# Patient Record
Sex: Female | Born: 1950 | Race: White | Hispanic: No | Marital: Married | Smoking: Never smoker
Health system: Southern US, Community
[De-identification: ages and names within clinical notes are randomized; demographics above are authoritative.]

## PROBLEM LIST (undated history)

## (undated) DIAGNOSIS — C801 Malignant (primary) neoplasm, unspecified: Secondary | ICD-10-CM

## (undated) DIAGNOSIS — G2581 Restless legs syndrome: Secondary | ICD-10-CM

## (undated) DIAGNOSIS — F419 Anxiety disorder, unspecified: Secondary | ICD-10-CM

## (undated) DIAGNOSIS — D259 Leiomyoma of uterus, unspecified: Secondary | ICD-10-CM

## (undated) DIAGNOSIS — E785 Hyperlipidemia, unspecified: Secondary | ICD-10-CM

## (undated) DIAGNOSIS — K589 Irritable bowel syndrome without diarrhea: Secondary | ICD-10-CM

## (undated) DIAGNOSIS — B029 Zoster without complications: Secondary | ICD-10-CM

## (undated) DIAGNOSIS — O3410 Maternal care for benign tumor of corpus uteri, unspecified trimester: Secondary | ICD-10-CM

## (undated) DIAGNOSIS — K5792 Diverticulitis of intestine, part unspecified, without perforation or abscess without bleeding: Secondary | ICD-10-CM

## (undated) DIAGNOSIS — M797 Fibromyalgia: Secondary | ICD-10-CM

## (undated) DIAGNOSIS — I82409 Acute embolism and thrombosis of unspecified deep veins of unspecified lower extremity: Secondary | ICD-10-CM

## (undated) DIAGNOSIS — N3010 Interstitial cystitis (chronic) without hematuria: Secondary | ICD-10-CM

## (undated) DIAGNOSIS — K529 Noninfective gastroenteritis and colitis, unspecified: Secondary | ICD-10-CM

## (undated) DIAGNOSIS — Z01419 Encounter for gynecological examination (general) (routine) without abnormal findings: Secondary | ICD-10-CM

## (undated) DIAGNOSIS — K6289 Other specified diseases of anus and rectum: Secondary | ICD-10-CM

## (undated) HISTORY — PX: TONSILLECTOMY: SUR1361

## (undated) HISTORY — DX: Anxiety disorder, unspecified: F41.9

## (undated) HISTORY — DX: Acute embolism and thrombosis of unspecified deep veins of unspecified lower extremity: I82.409

## (undated) HISTORY — DX: Interstitial cystitis (chronic) without hematuria: N30.10

## (undated) HISTORY — DX: Irritable bowel syndrome, unspecified: K58.9

## (undated) HISTORY — DX: Malignant (primary) neoplasm, unspecified: C80.1

## (undated) HISTORY — DX: Hyperlipidemia, unspecified: E78.5

## (undated) HISTORY — DX: Fibromyalgia: M79.7

## (undated) HISTORY — DX: Leiomyoma of uterus, unspecified: O34.10

## (undated) HISTORY — DX: Maternal care for benign tumor of corpus uteri, unspecified trimester: D25.9

## (undated) HISTORY — PX: COLONOSCOPY: SHX174

## (undated) HISTORY — DX: Encounter for gynecological examination (general) (routine) without abnormal findings: Z01.419

## (undated) HISTORY — PX: CHOLECYSTECTOMY: SHX55

## (undated) HISTORY — DX: Restless legs syndrome: G25.81

## (undated) HISTORY — DX: Other specified diseases of anus and rectum: K62.89

## (undated) HISTORY — DX: Diverticulitis of intestine, part unspecified, without perforation or abscess without bleeding: K57.92

## (undated) HISTORY — DX: Noninfective gastroenteritis and colitis, unspecified: K52.9

## (undated) HISTORY — DX: Zoster without complications: B02.9

---

## 1998-03-29 ENCOUNTER — Ambulatory Visit (HOSPITAL_COMMUNITY): Admission: RE | Admit: 1998-03-29 | Discharge: 1998-03-29 | Payer: Self-pay | Admitting: *Deleted

## 1998-08-07 ENCOUNTER — Other Ambulatory Visit: Admission: RE | Admit: 1998-08-07 | Discharge: 1998-08-07 | Payer: Self-pay | Admitting: Obstetrics and Gynecology

## 1999-08-13 ENCOUNTER — Other Ambulatory Visit: Admission: RE | Admit: 1999-08-13 | Discharge: 1999-08-13 | Payer: Self-pay | Admitting: Obstetrics and Gynecology

## 2000-04-14 ENCOUNTER — Encounter: Payer: Self-pay | Admitting: Otolaryngology

## 2000-04-14 ENCOUNTER — Ambulatory Visit (HOSPITAL_COMMUNITY): Admission: RE | Admit: 2000-04-14 | Discharge: 2000-04-14 | Payer: Self-pay | Admitting: Otolaryngology

## 2000-08-05 ENCOUNTER — Other Ambulatory Visit: Admission: RE | Admit: 2000-08-05 | Discharge: 2000-08-05 | Payer: Self-pay | Admitting: Obstetrics and Gynecology

## 2001-09-06 ENCOUNTER — Other Ambulatory Visit: Admission: RE | Admit: 2001-09-06 | Discharge: 2001-09-06 | Payer: Self-pay | Admitting: Obstetrics and Gynecology

## 2001-09-17 ENCOUNTER — Encounter: Payer: Self-pay | Admitting: Family Medicine

## 2001-09-17 ENCOUNTER — Encounter: Admission: RE | Admit: 2001-09-17 | Discharge: 2001-09-17 | Payer: Self-pay | Admitting: Family Medicine

## 2001-10-25 ENCOUNTER — Ambulatory Visit (HOSPITAL_BASED_OUTPATIENT_CLINIC_OR_DEPARTMENT_OTHER): Admission: RE | Admit: 2001-10-25 | Discharge: 2001-10-25 | Payer: Self-pay | Admitting: Pulmonary Disease

## 2001-11-15 ENCOUNTER — Encounter: Admission: RE | Admit: 2001-11-15 | Discharge: 2001-11-15 | Payer: Self-pay | Admitting: Internal Medicine

## 2001-11-15 ENCOUNTER — Encounter: Payer: Self-pay | Admitting: Internal Medicine

## 2002-11-25 ENCOUNTER — Other Ambulatory Visit: Admission: RE | Admit: 2002-11-25 | Discharge: 2002-11-25 | Payer: Self-pay | Admitting: Obstetrics and Gynecology

## 2004-04-01 ENCOUNTER — Other Ambulatory Visit: Admission: RE | Admit: 2004-04-01 | Discharge: 2004-04-01 | Payer: Self-pay | Admitting: Obstetrics and Gynecology

## 2004-05-07 ENCOUNTER — Ambulatory Visit (HOSPITAL_COMMUNITY): Admission: RE | Admit: 2004-05-07 | Discharge: 2004-05-07 | Payer: Self-pay | Admitting: Internal Medicine

## 2004-08-23 ENCOUNTER — Ambulatory Visit: Payer: Self-pay | Admitting: Family Medicine

## 2005-02-03 ENCOUNTER — Ambulatory Visit: Payer: Self-pay | Admitting: Family Medicine

## 2005-05-08 ENCOUNTER — Encounter: Admission: RE | Admit: 2005-05-08 | Discharge: 2005-05-08 | Payer: Self-pay | Admitting: Internal Medicine

## 2005-05-08 ENCOUNTER — Ambulatory Visit: Payer: Self-pay | Admitting: Family Medicine

## 2005-05-08 ENCOUNTER — Inpatient Hospital Stay (HOSPITAL_COMMUNITY): Admission: EM | Admit: 2005-05-08 | Discharge: 2005-05-10 | Payer: Self-pay | Admitting: Emergency Medicine

## 2005-05-08 ENCOUNTER — Ambulatory Visit: Payer: Self-pay | Admitting: Internal Medicine

## 2005-05-13 ENCOUNTER — Ambulatory Visit: Payer: Self-pay | Admitting: Family Medicine

## 2005-07-21 ENCOUNTER — Other Ambulatory Visit: Admission: RE | Admit: 2005-07-21 | Discharge: 2005-07-21 | Payer: Self-pay | Admitting: Obstetrics and Gynecology

## 2005-09-17 ENCOUNTER — Ambulatory Visit: Payer: Self-pay | Admitting: Family Medicine

## 2005-12-08 ENCOUNTER — Ambulatory Visit: Payer: Self-pay | Admitting: Family Medicine

## 2006-02-18 ENCOUNTER — Ambulatory Visit: Payer: Self-pay | Admitting: Family Medicine

## 2006-03-02 ENCOUNTER — Ambulatory Visit: Payer: Self-pay | Admitting: Family Medicine

## 2006-05-08 ENCOUNTER — Ambulatory Visit: Payer: Self-pay | Admitting: Family Medicine

## 2007-04-20 ENCOUNTER — Ambulatory Visit: Payer: Self-pay | Admitting: Family Medicine

## 2007-04-20 LAB — CONVERTED CEMR LAB
ALT: 22 units/L (ref 0–35)
AST: 28 units/L (ref 0–37)
BUN: 8 mg/dL (ref 6–23)
Bilirubin, Direct: 0.1 mg/dL (ref 0.0–0.3)
CO2: 32 meq/L (ref 19–32)
Calcium: 9.7 mg/dL (ref 8.4–10.5)
Chloride: 116 meq/L — ABNORMAL HIGH (ref 96–112)
Creatinine, Ser: 0.9 mg/dL (ref 0.4–1.2)
Eosinophils Absolute: 0.3 10*3/uL (ref 0.0–0.6)
Eosinophils Relative: 3.6 % (ref 0.0–5.0)
GFR calc Af Amer: 84 mL/min
Glucose, Bld: 92 mg/dL (ref 70–99)
MCV: 91.3 fL (ref 78.0–100.0)
Monocytes Relative: 6.8 % (ref 3.0–11.0)
Neutro Abs: 4.4 10*3/uL (ref 1.4–7.7)
Neutrophils Relative %: 57 % (ref 43.0–77.0)
Potassium: 4.6 meq/L (ref 3.5–5.1)
RBC: 4.56 M/uL (ref 3.87–5.11)
RDW: 12.2 % (ref 11.5–14.6)
Total Bilirubin: 0.8 mg/dL (ref 0.3–1.2)
Total CHOL/HDL Ratio: 3.7
Triglycerides: 90 mg/dL (ref 0–149)

## 2007-04-27 ENCOUNTER — Ambulatory Visit: Payer: Self-pay | Admitting: Family Medicine

## 2007-12-16 ENCOUNTER — Encounter: Payer: Self-pay | Admitting: Family Medicine

## 2008-02-15 ENCOUNTER — Emergency Department: Payer: Self-pay | Admitting: Internal Medicine

## 2008-02-21 ENCOUNTER — Ambulatory Visit: Payer: Self-pay | Admitting: Family Medicine

## 2008-02-21 DIAGNOSIS — K589 Irritable bowel syndrome without diarrhea: Secondary | ICD-10-CM | POA: Insufficient documentation

## 2008-02-21 DIAGNOSIS — E785 Hyperlipidemia, unspecified: Secondary | ICD-10-CM

## 2008-02-21 DIAGNOSIS — F411 Generalized anxiety disorder: Secondary | ICD-10-CM

## 2008-02-21 DIAGNOSIS — R040 Epistaxis: Secondary | ICD-10-CM

## 2008-02-21 DIAGNOSIS — Z86718 Personal history of other venous thrombosis and embolism: Secondary | ICD-10-CM | POA: Insufficient documentation

## 2008-02-21 DIAGNOSIS — J45909 Unspecified asthma, uncomplicated: Secondary | ICD-10-CM | POA: Insufficient documentation

## 2008-02-21 DIAGNOSIS — D259 Leiomyoma of uterus, unspecified: Secondary | ICD-10-CM | POA: Insufficient documentation

## 2008-02-21 DIAGNOSIS — J309 Allergic rhinitis, unspecified: Secondary | ICD-10-CM | POA: Insufficient documentation

## 2008-05-01 ENCOUNTER — Ambulatory Visit: Payer: Self-pay | Admitting: Family Medicine

## 2008-05-01 LAB — CONVERTED CEMR LAB
Bilirubin Urine: NEGATIVE
Ketones, urine, test strip: NEGATIVE
pH: 5

## 2008-05-02 LAB — CONVERTED CEMR LAB
AST: 23 units/L (ref 0–37)
Albumin: 3.6 g/dL (ref 3.5–5.2)
Alkaline Phosphatase: 83 units/L (ref 39–117)
Basophils Absolute: 0.1 10*3/uL (ref 0.0–0.1)
Basophils Relative: 1.2 % — ABNORMAL HIGH (ref 0.0–1.0)
Calcium: 9.5 mg/dL (ref 8.4–10.5)
Creatinine, Ser: 0.8 mg/dL (ref 0.4–1.2)
Glucose, Bld: 94 mg/dL (ref 70–99)
HCT: 40.1 % (ref 36.0–46.0)
Hemoglobin: 13.6 g/dL (ref 12.0–15.0)
LDL Cholesterol: 126 mg/dL — ABNORMAL HIGH (ref 0–99)
MCHC: 34 g/dL (ref 30.0–36.0)
MCV: 92.7 fL (ref 78.0–100.0)
Neutro Abs: 4.4 10*3/uL (ref 1.4–7.7)
Neutrophils Relative %: 53.2 % (ref 43.0–77.0)
RDW: 12.3 % (ref 11.5–14.6)
Sodium: 144 meq/L (ref 135–145)
Total Protein: 6.7 g/dL (ref 6.0–8.3)
WBC: 8.3 10*3/uL (ref 4.5–10.5)

## 2008-05-05 ENCOUNTER — Ambulatory Visit: Payer: Self-pay | Admitting: Family Medicine

## 2008-05-05 DIAGNOSIS — F411 Generalized anxiety disorder: Secondary | ICD-10-CM | POA: Insufficient documentation

## 2008-05-05 DIAGNOSIS — Z8719 Personal history of other diseases of the digestive system: Secondary | ICD-10-CM

## 2008-05-05 DIAGNOSIS — G2581 Restless legs syndrome: Secondary | ICD-10-CM | POA: Insufficient documentation

## 2008-06-05 ENCOUNTER — Emergency Department: Payer: Self-pay | Admitting: Emergency Medicine

## 2009-05-16 ENCOUNTER — Ambulatory Visit: Payer: Self-pay | Admitting: Family Medicine

## 2009-05-16 LAB — CONVERTED CEMR LAB
Bilirubin Urine: NEGATIVE
Glucose, Urine, Semiquant: NEGATIVE
Nitrite: NEGATIVE
Urobilinogen, UA: 0.2
WBC Urine, dipstick: NEGATIVE

## 2009-05-17 LAB — CONVERTED CEMR LAB
ALT: 26 units/L (ref 0–35)
AST: 24 units/L (ref 0–37)
Albumin: 4.3 g/dL (ref 3.5–5.2)
Alkaline Phosphatase: 97 units/L (ref 39–117)
BUN: 13 mg/dL (ref 6–23)
Basophils Relative: 0.5 % (ref 0.0–3.0)
CO2: 28 meq/L (ref 19–32)
Calcium: 9.7 mg/dL (ref 8.4–10.5)
Cholesterol: 214 mg/dL — ABNORMAL HIGH (ref 0–200)
Direct LDL: 147.7 mg/dL
Eosinophils Absolute: 0.4 10*3/uL (ref 0.0–0.7)
Glucose, Bld: 81 mg/dL (ref 70–99)
Lymphocytes Relative: 31.6 % (ref 12.0–46.0)
Monocytes Absolute: 0.6 10*3/uL (ref 0.1–1.0)
Neutro Abs: 4.5 10*3/uL (ref 1.4–7.7)
Potassium: 3.8 meq/L (ref 3.5–5.1)
RBC: 4.56 M/uL (ref 3.87–5.11)
RDW: 12.3 % (ref 11.5–14.6)
Sodium: 143 meq/L (ref 135–145)
TSH: 2.12 microintl units/mL (ref 0.35–5.50)
Total CHOL/HDL Ratio: 4
Triglycerides: 108 mg/dL (ref 0.0–149.0)
VLDL: 21.6 mg/dL (ref 0.0–40.0)

## 2009-06-01 ENCOUNTER — Ambulatory Visit: Payer: Self-pay | Admitting: Family Medicine

## 2009-11-21 ENCOUNTER — Ambulatory Visit: Payer: Self-pay | Admitting: Family Medicine

## 2009-11-21 DIAGNOSIS — J1189 Influenza due to unidentified influenza virus with other manifestations: Secondary | ICD-10-CM

## 2010-01-27 ENCOUNTER — Encounter: Payer: Self-pay | Admitting: Family Medicine

## 2010-01-28 ENCOUNTER — Ambulatory Visit: Payer: Self-pay | Admitting: Family Medicine

## 2010-01-28 ENCOUNTER — Telehealth (INDEPENDENT_AMBULATORY_CARE_PROVIDER_SITE_OTHER): Payer: Self-pay | Admitting: *Deleted

## 2010-01-28 DIAGNOSIS — K5732 Diverticulitis of large intestine without perforation or abscess without bleeding: Secondary | ICD-10-CM

## 2010-02-01 ENCOUNTER — Telehealth: Payer: Self-pay | Admitting: Family Medicine

## 2010-02-05 ENCOUNTER — Ambulatory Visit: Payer: Self-pay | Admitting: Family Medicine

## 2010-02-11 ENCOUNTER — Telehealth: Payer: Self-pay | Admitting: Family Medicine

## 2010-02-24 ENCOUNTER — Encounter: Payer: Self-pay | Admitting: Family Medicine

## 2010-08-17 ENCOUNTER — Encounter: Payer: Self-pay | Admitting: Family Medicine

## 2010-08-19 ENCOUNTER — Encounter: Payer: Self-pay | Admitting: Family Medicine

## 2010-09-23 ENCOUNTER — Ambulatory Visit: Payer: Self-pay | Admitting: Otolaryngology

## 2010-11-10 ENCOUNTER — Encounter: Payer: Self-pay | Admitting: Family Medicine

## 2010-11-21 NOTE — Assessment & Plan Note (Signed)
Summary: fu on diverticulitis/pt no better/njr   Vital Signs:  Patient profile:   60 year old female Weight:      149 pounds BMI:     28.49 O2 Sat:      98 % on Room air Temp:     98.2 degrees F oral Pulse rate:   64 / minute Pulse rhythm:   regular BP sitting:   106 / 74  (left arm) Cuff size:   regular  Vitals Entered By: Raechel Ache, RN (February 05, 2010 11:32 AM)  O2 Flow:  Room air CC: F/u diverticulitis- still has tenderness LLQ. Now has SOB and productive cough with brown mucous and had fever 100.7 yesterday. Went to Urgent Care- on Avelox and Flovent. Finished Cipro- has 2 Flagyl left.   History of Present Illness: Here for follow up. She finished a course of Cipro and Flagyl for her diverticulosis this past weekend. She feels like she is slowly but steadily getting better. The pain in her abdomen is almost gone. her fever has decreased from 101 degrees to 99.4 this am. Also over the weekend she developed chest congestion and a deep cough producing green sputum. She saw Urgent Care on Sunday, and they put her on 7 days of Avelox. Now the cough is better. her appetite is improving. No NVD.   Allergies: 1)  ! Iodine 2)  ! Versed 3)  ! Biaxin 4)  ! * Diphtheria Vaccine 5)  ! Nubain 6)  ! Demerol 7)  ! Floxin  Past History:  Past Medical History: Reviewed history from 06/01/2009 and no changes required. IBS Allergic rhinitis Asthma DVT, hx of Hyperlipidemia Uterine fibroids interstitial cystitis, sees Dr. Logan Bores sees Dr. Rana Snare for gyn exams fibromyalgia Diverticulitis, hx of, sees Dr. Arlyce Dice Anxiety sees Dr. Elenore Rota at [facility]  for ENT care restless legs  Review of Systems  The patient denies anorexia, weight loss, weight gain, vision loss, decreased hearing, hoarseness, chest pain, syncope, dyspnea on exertion, peripheral edema, headaches, hemoptysis, abdominal pain, melena, hematochezia, severe indigestion/heartburn, hematuria, incontinence, genital  sores, muscle weakness, suspicious skin lesions, transient blindness, difficulty walking, depression, unusual weight change, abnormal bleeding, enlarged lymph nodes, angioedema, breast masses, and testicular masses.    Physical Exam  General:  Well-developed,well-nourished,in no acute distress; alert,appropriate and cooperative throughout examination Head:  Normocephalic and atraumatic without obvious abnormalities. No apparent alopecia or balding. Eyes:  No corneal or conjunctival inflammation noted. EOMI. Perrla. Funduscopic exam benign, without hemorrhages, exudates or papilledema. Vision grossly normal. Ears:  External ear exam shows no significant lesions or deformities.  Otoscopic examination reveals clear canals, tympanic membranes are intact bilaterally without bulging, retraction, inflammation or discharge. Hearing is grossly normal bilaterally. Nose:  External nasal examination shows no deformity or inflammation. Nasal mucosa are pink and moist without lesions or exudates. Mouth:  Oral mucosa and oropharynx without lesions or exudates.  Teeth in good repair. Neck:  No deformities, masses, or tenderness noted. Lungs:  Normal respiratory effort, chest expands symmetrically. Lungs are clear to auscultation, no crackles or wheezes. Abdomen:  Bowel sounds positive,abdomen soft and non-tender without masses, organomegaly or hernias noted.   Impression & Recommendations:  Problem # 1:  DIVERTICULITIS OF COLON (ICD-562.11)  Problem # 2:  ACUTE BRONCHITIS (ICD-466.0)  Her updated medication list for this problem includes:    Proair Hfa 108 (90 Base) Mcg/act Aers (Albuterol sulfate) .Marland Kitchen... 2 inh q4h as needed shortness of breath  Complete Medication List: 1)  Lipitor 10 Mg  Tabs (Atorvastatin calcium) .Marland Kitchen.. 1 by mouth daily 2)  Proair Hfa 108 (90 Base) Mcg/act Aers (Albuterol sulfate) .... 2 inh q4h as needed shortness of breath 3)  Caltrate 600+d 600-400 Mg-unit Tabs (Calcium  carbonate-vitamin d) .... Take 1 tablet by mouth two times a day 4)  Metamucil 30.9 % Powd (Psyllium) .... As needed/daily 5)  Estrace 0.1 Mg/gm Crea (Estradiol) .... Once daily as needed 6)  Alprazolam 0.25 Mg Tabs (Alprazolam) .... Two times a day as needed anxiety 7)  Macrodantin (pt Unsure of Dosage)  8)  Estradiol  9)  Sonata 5 Mg Caps (Zaleplon) .... At bedtime  Patient Instructions: 1)  The diverticulitis has resolved, and now she has some bronchitis. Finish out the Avelox.

## 2010-11-21 NOTE — Progress Notes (Signed)
Summary: med making her sick  Phone Note Call from Patient Call back at Home Phone [phone]   Caller: Patient Call For: Nelwyn Salisbury MD Summary of Call: Flagyl is making her sick and is throwing up- worried she's not going to get better because not keeping med down. Is there something else besides Flagyl?  Lower dose?  [facility] [address] [place]. Initial call taken by: Raechel Ache, RN,  February 01, 2010 8:03 AM  Follow-up for Phone Call        stop Flagyl but continue on Cipro. Let us know how she is doing next week.  Follow-up by: Nelwyn Salisbury MD,  February 01, 2010 8:40 AM  Additional Follow-up for Phone Call Additional follow up Details #1::        LMTCB Additional Follow-up by: Raechel Ache, RN,  February 01, 2010 8:46 AM     Appended Document: med making her sick called.

## 2010-11-21 NOTE — Letter (Signed)
Summary: [facility]   Imported By: Maryln Gottron 03/04/2010 13:01:42  _____________________________________________________________________  External Attachment:    Type:   Image     Comment:   External Document

## 2010-11-21 NOTE — Assessment & Plan Note (Signed)
Summary: COUGH, ACHING, SOB // RS   Vital Signs:  Patient profile:   60 year old female Weight:      150 pounds Temp:     101.4 degrees F oral Pulse rate:   94 / minute BP sitting:   136 / 80  (left arm) Cuff size:   regular  Vitals Entered By: Alfred Levins, CMA (November 21, 2009 11:43 AM) CC: fever, chills, achy x1 day   History of Present Illness: Here for several different symptom complexes. First about 2 weeks ago she developed HAs, sinus pressure, ST, and PND. These symptoms continue to bother her. Then yesterday she had the sudden onset of fevers, body aches, and a dry cough. She is very fatigued. No NVD. On Ibuprofen and fluids.   Allergies: 1)  ! Iodine 2)  ! Versed 3)  ! Biaxin 4)  ! * Diphtheria Vaccine 5)  ! Nubain 6)  ! Demerol 7)  ! Floxin  Past History:  Past Medical History: Reviewed history from 06/01/2009 and no changes required. IBS Allergic rhinitis Asthma DVT, hx of Hyperlipidemia Uterine fibroids interstitial cystitis, sees Dr. Logan Bores sees Dr. Rana Snare for gyn exams fibromyalgia Diverticulitis, hx of, sees Dr. Arlyce Dice Anxiety sees Dr. Elenore Rota at [facility]  for ENT care restless legs  Review of Systems  The patient denies anorexia, weight loss, weight gain, vision loss, decreased hearing, hoarseness, chest pain, syncope, dyspnea on exertion, peripheral edema, hemoptysis, abdominal pain, melena, hematochezia, severe indigestion/heartburn, hematuria, incontinence, genital sores, muscle weakness, suspicious skin lesions, transient blindness, difficulty walking, depression, unusual weight change, abnormal bleeding, enlarged lymph nodes, angioedema, breast masses, and testicular masses.    Physical Exam  General:  she appears ill but alert Head:  Normocephalic and atraumatic without obvious abnormalities. No apparent alopecia or balding. Eyes:  No corneal or conjunctival inflammation noted. EOMI. Perrla. Funduscopic exam benign, without hemorrhages,  exudates or papilledema. Vision grossly normal. Ears:  External ear exam shows no significant lesions or deformities.  Otoscopic examination reveals clear canals, tympanic membranes are intact bilaterally without bulging, retraction, inflammation or discharge. Hearing is grossly normal bilaterally. Nose:  External nasal examination shows no deformity or inflammation. Nasal mucosa are pink and moist without lesions or exudates. Mouth:  Oral mucosa and oropharynx without lesions or exudates.  Teeth in good repair. Neck:  No deformities, masses, or tenderness noted. Lungs:  Normal respiratory effort, chest expands symmetrically. Lungs are clear to auscultation, no crackles or wheezes.   Impression & Recommendations:  Problem # 1:  ACUTE SINUSITIS, UNSPECIFIED (ICD-461.9)  Her updated medication list for this problem includes:    Augmentin 875-125 Mg Tabs (Amoxicillin-pot clavulanate) .Marland Kitchen..Marland Kitchen Two times a day  Problem # 2:  INFLUENZA (ICD-487.8)  Complete Medication List: 1)  Lipitor 10 Mg Tabs (Atorvastatin calcium) .Marland Kitchen.. 1 by mouth daily 2)  Proair Hfa 108 (90 Base) Mcg/act Aers (Albuterol sulfate) .... 2 inh q4h as needed shortness of breath 3)  Caltrate 600+d 600-400 Mg-unit Tabs (Calcium carbonate-vitamin d) .... Take 1 tablet by mouth two times a day 4)  Metamucil 30.9 % Powd (Psyllium) .... As needed/daily 5)  Estrace 0.1 Mg/gm Crea (Estradiol) .... Once daily as needed 6)  Alprazolam 0.25 Mg Tabs (Alprazolam) .... Two times a day as needed anxiety 7)  Urelle 81 Mg Tabs (Meth-hyo-m bl-na phos-ph sal) 8)  Macrodantin (pt Unsure of Dosage)  9)  Estradiol  10)  Requip 0.5 Mg Tabs (Ropinirole hcl) .... At bedtime 11)  Sonata 5 Mg  Caps (Zaleplon) .... At bedtime 12)  Augmentin 875-125 Mg Tabs (Amoxicillin-pot clavulanate) .... Two times a day 13)  Tamiflu 75 Mg Caps (Oseltamivir phosphate) .... Two times a day  Patient Instructions: 1)  She has influenza superimposed on a pre-existing  sinusitis. We will treat both.  Prescriptions: TAMIFLU 75 MG CAPS (OSELTAMIVIR PHOSPHATE) two times a day  #14 x 0   Entered and Authorized by:   Nelwyn Salisbury MD   Signed by:   Nelwyn Salisbury MD on 11/21/2009   Method used:   Print then Give to Patient   RxID:   [id number] AUGMENTIN 875-125 MG TABS (AMOXICILLIN-POT CLAVULANATE) two times a day  #20 x 0   Entered and Authorized by:   Nelwyn Salisbury MD   Signed by:   Nelwyn Salisbury MD on 11/21/2009   Method used:   Print then Give to Patient   RxID:   [id number] SONATA 5 MG CAPS (ZALEPLON) at bedtime  #30 x 5   Entered and Authorized by:   Nelwyn Salisbury MD   Signed by:   Nelwyn Salisbury MD on 11/21/2009   Method used:   Print then Give to Patient   RxID:   [id number] PROAIR HFA 108 (90 BASE) MCG/ACT  AERS (ALBUTEROL SULFATE) 2 inh q4h as needed shortness of breath  #2 x 11   Entered and Authorized by:   Nelwyn Salisbury MD   Signed by:   Nelwyn Salisbury MD on 11/21/2009   Method used:   Print then Give to Patient   RxID:   [id number]

## 2010-11-21 NOTE — Letter (Signed)
Summary: [facility]  [facility]   Imported By: Maryln Gottron 02/04/2010 10:15:50  _____________________________________________________________________  External Attachment:    Type:   Image     Comment:   External Document

## 2010-11-21 NOTE — Miscellaneous (Signed)
Summary: flu inj at [facility]   Clinical Lists Changes  Observations: Added new observation of FLU VAX: Historical (08/17/2010 16:19)      Immunization History:  Influenza Immunization History:    Influenza:  historical (08/17/2010) given at [address] [place]  lot number [id number]............gh rn....Marland KitchenMarland Kitchen

## 2010-11-21 NOTE — Progress Notes (Signed)
Summary: wants ov today or relief  Phone Note Call from Patient Call back at [facility] Phone [phone] Call back at [phone] or [phone]   Caller: Patient--live call Summary of Call: pt called again and is still not any better of her diverticulitis. was seen last week. Initial call taken by: Warnell Forester,  February 11, 2010 10:17 AM  Follow-up for Phone Call        wants to refill the antibiotics for this remaining sore area in her abdomen.  [facility] [place]  Follow-up by: Lynann Beaver CMA,  February 11, 2010 10:50 AM  Additional Follow-up for Phone Call Additional follow up Details #1::        call in another 10 days of Avelox 100mg  once daily  Additional Follow-up by: Nelwyn Salisbury MD,  February 11, 2010 2:03 PM    Additional Follow-up for Phone Call Additional follow up Details #2::    Rx called. Follow-up by: Raechel Ache, RN,  February 11, 2010 2:34 PM

## 2010-11-21 NOTE — Miscellaneous (Signed)
Summary: Flu Shot/[facility]  [facility]   Imported By: Maryln Gottron 08/22/2010 09:42:17  _____________________________________________________________________  External Attachment:    Type:   Image     Comment:   External Document

## 2010-11-21 NOTE — Progress Notes (Signed)
Summary: Call-A-Nurse Report    [facility]-Nurse Triage Call Report Triage Record Num: [id number] Operator: Tomasita Crumble Patient Name: Shelley Jones Call Date & Time: 01/27/2010 10:14:23AM Patient Phone: [phone] PCP: Tera Mater. Clent Ridges Patient Gender: Female PCP Fax : [phone] Patient DOB: June 24, 1951 Practice Name: [facility] Reason for Call: Pt. calling; states she has pain in LLQ abdomen. Onset 4/10. Reports hx. of diverticulitis. + stool 4/10; reports "It feels like a fire in there". Deep, throbbing burning pain rated at 6 of 10. Constant > 3 hours, interferes w/ appetite, activity. Advised ED per Abd Swelling protocol. Pt. states pref for [facility]. Protocol(s) Used: Abdominal Swelling, Bloating Recommended Outcome per Protocol: See ED Immediately Reason for Outcome: Pain described as deep, boring, or tearing Care Advice:  ~ Allow the patient to be in a position of comfort.  ~ Another adult should drive.  ~ Do not give the patient anything to eat or drink.  ~ Do not push on abdomen. Write down provider's name. List or place the following in a bag for transport with the patient: current prescription and/or OTC medications; alternative treatments, therapies and medications; and street drugs.  ~ 01/27/2010 10:25:06AM Page 1 of 1 [email or web address]

## 2010-11-21 NOTE — Assessment & Plan Note (Signed)
Summary: F/U ON DIVERTICULITIS FLARE UP // RS   Vital Signs:  Patient profile:   60 year old female Weight:      148 pounds Temp:     98.7 degrees F oral BP sitting:   98 / 66  (left arm) Cuff size:   regular  Vitals Entered By: Raechel Ache, RN (January 28, 2010 3:49 PM) CC: C/o LLQ pain which started Sat night with stabbing pain. Went to Urgent Care yesterday, temp 101.7, WBC's were 17000. Taking Cipro and Flagyl; feels little better today but still in pain.   History of Present Illness: Here for another bout of diverticulitis. Over this past weekend she developed LLQ abdominal pains and fever to 101 degrees. Went to Urgent Care yesterday for labs and Xrays. Her WBC was reportedly over 17 K. She was started on Cipro and Flagyl for 7 days. Today she feels a bit better. Still has some pains, but her fever is gone. No N or V.   Allergies: 1)  ! Iodine 2)  ! Versed 3)  ! Biaxin 4)  ! * Diphtheria Vaccine 5)  ! Nubain 6)  ! Demerol 7)  ! Floxin  Past History:  Past Medical History: Reviewed history from 06/01/2009 and no changes required. IBS Allergic rhinitis Asthma DVT, hx of Hyperlipidemia Uterine fibroids interstitial cystitis, sees Dr. Logan Bores sees Dr. Rana Snare for gyn exams fibromyalgia Diverticulitis, hx of, sees Dr. Arlyce Dice Anxiety sees Dr. Elenore Rota at [facility]  for ENT care restless legs  Past Surgical History: Reviewed history from 06/01/2009 and no changes required. Tonsillectomy Cholecystectomy all  attempts at colonoscopy (last in  1995)  was unsuccessful due to a very tortuous colon   Review of Systems  The patient denies anorexia, fever, weight loss, weight gain, vision loss, decreased hearing, hoarseness, chest pain, syncope, dyspnea on exertion, peripheral edema, prolonged cough, headaches, hemoptysis, melena, hematochezia, severe indigestion/heartburn, hematuria, incontinence, genital sores, muscle weakness, suspicious skin lesions, transient blindness,  difficulty walking, depression, unusual weight change, abnormal bleeding, enlarged lymph nodes, angioedema, breast masses, and testicular masses.    Physical Exam  General:  Well-developed,well-nourished,in no acute distress; alert,appropriate and cooperative throughout examination Neck:  No deformities, masses, or tenderness noted. Lungs:  Normal respiratory effort, chest expands symmetrically. Lungs are clear to auscultation, no crackles or wheezes. Heart:  Normal rate and regular rhythm. S1 and S2 normal without gallop, murmur, click, rub or other extra sounds. Abdomen:  soft, normal bowel sounds, no distention, no masses, no guarding, no rigidity, no rebound tenderness, no abdominal hernia, no inguinal hernia, no hepatomegaly, and no splenomegaly.  Moderately  tender in the LLQ   Impression & Recommendations:  Problem # 1:  DIVERTICULITIS OF COLON (ICD-562.11)  Complete Medication List: 1)  Lipitor 10 Mg Tabs (Atorvastatin calcium) .Marland Kitchen.. 1 by mouth daily 2)  Proair Hfa 108 (90 Base) Mcg/act Aers (Albuterol sulfate) .... 2 inh q4h as needed shortness of breath 3)  Caltrate 600+d 600-400 Mg-unit Tabs (Calcium carbonate-vitamin d) .... Take 1 tablet by mouth two times a day 4)  Metamucil 30.9 % Powd (Psyllium) .... As needed/daily 5)  Estrace 0.1 Mg/gm Crea (Estradiol) .... Once daily as needed 6)  Alprazolam 0.25 Mg Tabs (Alprazolam) .... Two times a day as needed anxiety 7)  Macrodantin (pt Unsure of Dosage)  8)  Estradiol  9)  Sonata 5 Mg Caps (Zaleplon) .... At bedtime  Patient Instructions: 1)  Continue on the current regimen. Slowly advance the diet.  2)  Please schedule a follow-up appointment as needed .

## 2010-12-23 ENCOUNTER — Ambulatory Visit (INDEPENDENT_AMBULATORY_CARE_PROVIDER_SITE_OTHER): Payer: BC Managed Care – PPO | Admitting: Family Medicine

## 2010-12-23 ENCOUNTER — Encounter: Payer: Self-pay | Admitting: Family Medicine

## 2010-12-23 VITALS — BP 160/88 | Temp 98.5°F | Ht 60.5 in | Wt 151.0 lb

## 2010-12-23 DIAGNOSIS — B349 Viral infection, unspecified: Secondary | ICD-10-CM

## 2010-12-23 DIAGNOSIS — R05 Cough: Secondary | ICD-10-CM

## 2010-12-23 DIAGNOSIS — R030 Elevated blood-pressure reading, without diagnosis of hypertension: Secondary | ICD-10-CM

## 2010-12-23 NOTE — Patient Instructions (Signed)
Whooping Cough (Pertussis) Whooping cough (pertussis) is a germ (bacterial) infection of the air passages and lungs. It causes severe and sudden attacks of coughing. Coughing attacks may occur frequently and last about 2 minutes. Your child may have these attacks for at least 2 weeks and often longer. Mild coughing may continue for several months from remaining inflammation (the body's way of reacting to injury and/or infection) in the lungs even though infection is no longer present. Pertussis is very contagious and usually affects infants, children and adolescents. It is less common in adults. SYMPTOMS  During the incubation period someone exposed to whooping cough will not show symptoms. This period may last 7-10 days.   Early Illness (catarrhal stage): Initial symptoms of whooping cough are similar to the common cold. Your child may have runny nose, sneezing, loss of appetite, mild cough and low grade fever. These symptoms often last 2 to 7 days.   Late Illness (paroxysmal stage): This stage may last 1 to 8 weeks. During this period the severe and sudden cough attacks develop. Coughing is often provoked by activity. In infants it may be cause by feeding. After a severe cough patients older than 6 months may gasp or "whoop" for air. The cough will gradually subside and becomes less episodic over time. Newborns and young infants do not have the strength to develop this "whoop" sound and may instead have periods where they cannot breath.  DIAGNOSIS AND TREATMENT  If your child has a prolonged respiratory illness, has been exposed to someone with whooping cough or suspected to have whooping cough a visit to a medical practitioner is recommended.   Your child's caregiver may recommend blood tests or mucous swabs of the nose and throat to help confirm the diagnosis.   Your child may have a chest x-ray.   Antibiotics may be prescribed for the infection.   In confirmed cases of whooping cough your  caregiver may prescribe antibiotics for people in your household that have come in contact with the patient. Your caregiver may recommend immunizations for people in your household at risk of developing disease.   It is recommended that your child's school or daycare be informed if your child has whooping cough.  HOME CARE INSTRUCTIONS  Most cases of whooping cough are treated at home.   Children under the age of 6 months are at a higher risk of becoming sicker.   If given a prescription for medicine that kills germs (antibiotics), take it as directed until gone. Symptoms may only improve slightly with antibiotic treatment, but the spread of the illness will be reduced.   Whooping cough is contagious for 5 days after treatment begins. Keep the infected person away from others who:   Have not had their full course of whooping cough immunizations.   Have not had their recent booster shot.   Are pregnant.   Frequent hand washing for all those in the household help prevent the spread of germs.   Avoid exposing your child to any substances that may irritate their respiratory tract such as smoke, aerosols or fumes as they may worsen coughing.   Patients with whooping cough cannot return to school or daycare until they have been treated with antibiotics for 5 days.   Do not give cough medicine unless prescribed by your caregiver. Coughing is a protective mechanism which helps keep sputum and secretions from clogging breathing passages.   Use a cool mist humidifier to increase air moisture. This will soothe the cough and  help loosen sputum. Do not use hot steam.   If your child is having a coughing spell:   Raising the head of an infant's mattress may help a baby to clear sputum more easily and improve breathing.   Older children may sit upright during coughing spells. They may also sleep more comfortably with the head of their bed raised at night.   Have your child rest as much as  possible. Normal activity may gradually be resumed.   Encourage your child to drink extra fluids. Small frequent meals may decrease vomiting which is caused by the coughing attacks.   This infection can get worse after your hospital or office visit. Monitor your or your child's condition carefully until there is improvement.  SEEK MEDICAL ATTENTION FOR:  You or your child has an oral temperature above 101.   Your baby is older than 3 months with a rectal temperature of 100.5 F (38.1 C) or higher for more than 1 day.   Frequent vomiting.   Your child is not able to eat or drink fluids.   Your child is urinating less frequently or has dry lips or sunken eye (dehydrated).   Your child has repetitive coughing that gets worse.   Your child does not seem to be improving.  SEEK IMMEDIATE MEDICAL ATTENTION IF:  Your child's lips or skin turn blue during a coughing spell.   Your child has trouble breathing or periods when breathing slows or stops.   Your child is restless or cannot sleep or is acting listless and sleeping too much.   Your child is not acting normally.   You or your child has an oral temperature above 101, not controlled by medicine.   Your baby is older than 3 months with a rectal temperature of 102 F (38.9 C) or higher.   Your baby is 11 months old or younger with a rectal temperature of 100.4 F (38 C) or higher.  Document Released: 10/03/2000 Document Re-Released: 03/26/2010 [facility] Patient Information 2011 [place], [place].

## 2010-12-23 NOTE — Progress Notes (Signed)
  Subjective:    Patient ID: Shelley Jones, female    DOB: 09/30/1951, 60 y.o.   MRN: [id number]  HPI  patient seen to discuss some concerns regarding pertussis. 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 Apparently, there have been some confirmed cases of pertussis in that county. Patient especially concerned because she has a daughter who is pregnant. Patient has had only minimal postnasal drip symptoms and rare dry cough past 3-4 days. No known exposure to pertussis. She denies any fever. Nonsmoker.    last tetanus 1998. She apparently had some fever afterwards and has not taken any  Tetanus vaccine including Tdap since then.   Review of Systems  Constitutional: Negative for fever and chills.  HENT: Positive for congestion, rhinorrhea and postnasal drip. Negative for sinus pressure.   Respiratory: Positive for cough. Negative for shortness of breath, wheezing and stridor.   Cardiovascular: Negative for leg swelling.       Objective:   Physical Exam  patient is alert and in no distress. She is afebrile Oropharynx is clear Eardrums normal Neck supple no adenopathy Chest clear to auscultation without wheezes rales or rhonchi noted Heart regular rhythm and rate with no murmur Extremities no edema       Assessment & Plan:  #1 probable viral syndrome. She has significant anxiety regarding pertussis. We discussed different stages of pertussis and things to look out for. At this point she has not had any known exposures. We recommend observation this time. Discussed with primary physician whether she is a candidate for Tdap.  #2 elevated blood pressure with no prior history of hypertension. Reassess with primary physician in 2 weeks

## 2010-12-24 ENCOUNTER — Encounter: Payer: Self-pay | Admitting: Family Medicine

## 2010-12-24 ENCOUNTER — Ambulatory Visit (INDEPENDENT_AMBULATORY_CARE_PROVIDER_SITE_OTHER): Payer: BC Managed Care – PPO | Admitting: Family Medicine

## 2010-12-24 VITALS — BP 130/90 | Temp 98.5°F | Wt 151.0 lb

## 2010-12-24 DIAGNOSIS — Z0000 Encounter for general adult medical examination without abnormal findings: Secondary | ICD-10-CM

## 2010-12-24 DIAGNOSIS — R030 Elevated blood-pressure reading, without diagnosis of hypertension: Secondary | ICD-10-CM

## 2010-12-24 DIAGNOSIS — J069 Acute upper respiratory infection, unspecified: Secondary | ICD-10-CM

## 2010-12-24 DIAGNOSIS — Z23 Encounter for immunization: Secondary | ICD-10-CM

## 2010-12-24 NOTE — Progress Notes (Signed)
  Subjective:    Patient ID: Shelley Jones, female    DOB: [birth year], 60 y.o.   MRN: [id number]  HPI Here to follow up on a viral URI, on elevated BP, and to discuss the wisdom of getting a TDaP shot. She was here yetserday with some chest congestion and a dry cough, no fever. Her BP was high then at 166 systolic. She admits to being very stressed yesterday, and she feels much more relaxed today. Today the cough is better and she feels almost baclk to normal. She asks if she can get a TDaP today.    Review of Systems  Constitutional: Negative.   HENT: Negative.   Respiratory: Positive for cough. Negative for chest tightness, shortness of breath and wheezing.   Cardiovascular: Negative.        Objective:   Physical Exam  Constitutional: She appears well-developed and well-nourished. No distress.  Neck: Neck supple.  Cardiovascular: Normal rate, regular rhythm and normal heart sounds.   Pulmonary/Chest: Effort normal and breath sounds normal. No respiratory distress. She has no wheezes. She has no rales. She exhibits no tenderness.  Lymphadenopathy:    She has no cervical adenopathy.          Assessment & Plan:  Her BP is borderline today but much better than yesterday, We will continue to monitor this closely. The URI has almost resolved. Given the TDaP.

## 2011-01-13 ENCOUNTER — Ambulatory Visit (INDEPENDENT_AMBULATORY_CARE_PROVIDER_SITE_OTHER): Payer: BC Managed Care – PPO | Admitting: Family Medicine

## 2011-01-13 ENCOUNTER — Encounter: Payer: Self-pay | Admitting: Family Medicine

## 2011-01-13 VITALS — BP 140/84 | HR 91 | Temp 99.5°F

## 2011-01-13 DIAGNOSIS — R233 Spontaneous ecchymoses: Secondary | ICD-10-CM

## 2011-01-13 NOTE — Progress Notes (Signed)
  Subjective:    Patient ID: Shelley Jones, female    DOB: 01/10/1951, 60 y.o.   MRN: [id number]  HPI Here to ask about red spots that come and go on the backs of her hands for the past year or so. They are never symptomatic. They start as small red well defined areas which then spread and become more faint, then fade away completely. These do not come anywhere else on her body. She does not take aspirin.   Review of Systems  Constitutional: Negative.   Skin: Positive for rash.       Objective:   Physical Exam  Constitutional: She appears well-developed and well-nourished.  Skin:       Clear except for a single red macular area on the back of each hand, one is about 3mm in diameter, the other about 6 mm. They are homogeneous in color.           Assessment & Plan:  These are probably benign but we will check some labs today.

## 2011-01-14 LAB — HEPATIC FUNCTION PANEL
ALT: 21 U/L (ref 0–35)
AST: 21 U/L (ref 0–37)
Albumin: 4 g/dL (ref 3.5–5.2)
Alkaline Phosphatase: 82 U/L (ref 39–117)
Bilirubin, Direct: 0.1 mg/dL (ref 0.0–0.3)

## 2011-01-14 LAB — BASIC METABOLIC PANEL
CO2: 29 mEq/L (ref 19–32)
Chloride: 109 mEq/L (ref 96–112)
Creatinine, Ser: 0.9 mg/dL (ref 0.4–1.2)
Potassium: 4.3 mEq/L (ref 3.5–5.1)

## 2011-01-14 LAB — CBC WITH DIFFERENTIAL/PLATELET
Basophils Absolute: 0 10*3/uL (ref 0.0–0.1)
Eosinophils Absolute: 0.5 10*3/uL (ref 0.0–0.7)
Eosinophils Relative: 5.4 % — ABNORMAL HIGH (ref 0.0–5.0)
HCT: 40 % (ref 36.0–46.0)
MCHC: 33.9 g/dL (ref 30.0–36.0)
MCV: 93.1 fl (ref 78.0–100.0)
Platelets: 250 10*3/uL (ref 150.0–400.0)
RBC: 4.3 Mil/uL (ref 3.87–5.11)
WBC: 9 10*3/uL (ref 4.5–10.5)

## 2011-01-14 LAB — TSH: TSH: 1.65 u[IU]/mL (ref 0.35–5.50)

## 2011-01-14 LAB — APTT: aPTT: 29.8 s — ABNORMAL HIGH (ref 21.7–28.8)

## 2011-01-14 LAB — PROTIME-INR: Prothrombin Time: 11.2 s (ref 10.2–12.4)

## 2011-01-16 ENCOUNTER — Telehealth: Payer: Self-pay

## 2011-01-16 NOTE — Telephone Encounter (Signed)
Mess left and requested to call if any questions

## 2011-01-16 NOTE — Telephone Encounter (Signed)
Message copied by Madison Hickman on Thu Jan 16, 2011 10:33 AM ------      Message from: Dwaine Deter      Created: Thu Jan 16, 2011  8:44 AM       All normal

## 2011-03-04 NOTE — Assessment & Plan Note (Signed)
[facility]                            [facility] OFFICE NOTE   NAME:COLLINSGenelda, Roark                      MRN:          [id number]  DATE:04/27/2007                            DOB:          11/27/1950    This is a 60 year old woman for a nongynecological physical examination.  She feels fine, and has no complaints.  In fact, she is proud to say  that she feels better than she has for years.  She has a history of  fibromyalgia, which had bothered her with stiffness and aching over her  body for a number of years.  She recently joined a gym, and is now  working out on a regular basis.  She feels more energy, and has less  aches and pains that she has in quite some time.  Otherwise, we have  been following her for diverticular disease of the colon by watching her  diet closely.  She has had no flare ups of diverticulitis since we saw  her 1 year ago.  Because she has an extremely tortuous colon, additional  colonoscopy has been impossible for her.  Last year I recommended that  she have a virtual colonoscopy done, since this is now available through  our [facility].  She inquired about this, but her  insurance denied coverage.  She is now considering whether she would  like to pursue doing this, but simply paying out of pocket for it.  Her  allergies and asthma have been under very good control.  She continues  to see Dr. Logan Bores for urologic care, especially for interstitial  cystitis.  She sees Dr. Rana Snare on a regular basis for Gynecology exams.   For further details of her past medical history, family history, social  history, habits, etc., refer to our last physical note dated Mar 02, 2006.   ALLERGIES:  1. IODINE.  2. VERSED.  3. BIAXIN.  4. DIPHTHERIA VACCINE.  5. NUBAIN.  6. DEMEROL.  7. FLOXIN.   CURRENT MEDICATIONS:  1. Lipitor 10 mg per day.  2. Aspirin 81 mg per day.  3. Calcium.  4. Vitamin D.  5. Multivitamins  daily.  6. Metamucil daily.  7. Macrobid 100 mg daily.  8. Albuterol inhaler as needed.   OBJECTIVE:  Height 5 feet zero inches.  Weight 150.  BP 112/68.  Pulse  84 and regular.  GENERAL:  She remains a little over weight.  SKIN:  Clear.  EYES:  Clear.  EARS:  Clear.  PHARYNX:  Clear.  NECK:  Supple without lymphadenopathy or masses.  LUNGS:  Clear.  CARDIAC:  Rate and rhythm regular without gallops, murmurs, or rubs.  Distal pulses are full.  EKG:  Within normal limits.  ABDOMEN:  Soft.  Normal bowel sounds.  Nontender.  No masses.  EXTREMITIES:  No clubbing, cyanosis, or edema.  NEUROLOGIC:  Exam is grossly intact.   She was here for fasting labs on April 20, 2007.  These were all within  normal limits except for her lipid panel.  However, her lipid panel is  actually looking the  best I have ever seen it.  Total cholesterol is  down to 192, HDL is 51, and LDL is down to 123.   ASSESSMENT AND PLAN:  1. Complete physical.  I encouraged her to continue with her exercise      regimen, and I did encourage her to go ahead and pay out of pocked      for a virtual colonoscopy since she has a strong family history of      colon cancer.  2. Diverticular disease.  Stable with dietary controls.  3. Interstitial cystitis.  Stable.  4. Hyperlipidemia.  She will continue with her current medication and      diet regimen.  5. Allergies.  Stable.  6. Asthma.  Stable.     Tera Mater. Clent Ridges, MD  Electronically Signed    SAF/MedQ  DD: 04/27/2007  DT: 04/28/2007  Job #: [id number]

## 2011-03-07 NOTE — Discharge Summary (Signed)
Shelley Jones, Shelley Jones             ACCOUNT NO.:  [id number]   MEDICAL RECORD NO.:  [id number]          PATIENT TYPE:  INP   LOCATION:  1603                         FACILITY:  [facility]   PHYSICIAN:  Georgina Quint. Plotnikov, M.D. Romualdo Bolk OF BIRTH:  [birth year]   DATE OF ADMISSION:  05/08/2005  DATE OF DISCHARGE:  05/10/2005                                 DISCHARGE SUMMARY   DISCHARGE DIAGNOSES:  1.  Acute diverticulitis.  2.  Elevated white count due to problem #1.  3.  Abdominal pain due to problem #1, improved.   DISCHARGE MEDICINES:  1.  Cipro 500 mg b.i.d. x 10 days.  2.  Flagyl 500 mg t.i.d. x 7 days.  3.  Phenergan 25/50 mg q.i.d. p.r.n. nausea.  4.  Darvocet-N 100, 1-2 q.i.d. p.r.n. pain.   ACTIVITY:  As tolerated.   DIET:  Low residue diet for 14 days, then assume high fiber diet for  diverticulosis.   SPECIAL INSTRUCTIONS:  1.  Hold Lipitor for 10 days.  2.  Follow up with Dr. Clent Ridges next week.   HISTORY:  The patient is a 60 year old female with a history of recurrent  diverticulitis who was admitted by Dr. Tawanna Cooler on May 08, 2005, with abdominal  pain.  For the details, please address the history and physical on May 08, 2005.  She was started on IV antibiotics.  Her white count on admission was  around 16, normalized on the day of discharge.  Chemistry looked fine.  During the course of hospitalization, she has improved; however, on the day  of discharge, she is feeling a little bit more bloating and nausea then  yesterday.  She attributed it to not sleeping well and hospital food and  still wanted to go home, thinking that she will get better faster at home.   On the day of discharge, she is in no acute distress.  Temperature 97.7,  heart rate 86, respirations 20, blood pressure 131/79, saturations 98% on  room air.  She is in no acute distress, looks a little tired.  HEENT: With  moist mucosa.  She had half of her breakfast.  Lungs clear to auscultation, heart regular  rate and rhythm, abdomen sensitive in the lower half, more on the left, no  rebounds or masses.   LABORATORY DATA:  White count 9.3, hemoglobin 11.9, sodium 135, potassium  3.7.     __    AVP/MEDQ  D:  05/10/2005  T:  05/10/2005  Job:  [id number]   cc:   Tera Mater. Clent Ridges, M.D. [facility]

## 2011-03-07 NOTE — H&P (Signed)
NAMEDECIE, Shelley Jones             ACCOUNT NO.:  [id number]   MEDICAL RECORD NO.:  [id number]          PATIENT TYPE:  INP   LOCATION:  1603                         FACILITY:  [facility]   PHYSICIAN:  Eugenio Hoes. Tawanna Cooler, M.D. [facility] OF BIRTH:  01/16/1951   DATE OF ADMISSION:  05/08/2005  DATE OF DISCHARGE:                                HISTORY & PHYSICAL   PRIMARY CARE PHYSICIAN:  Jeannett Senior A. Clent Ridges, M.D.   HOSPITALIST:  Rene Paci, M.D.   Shelley Jones is a 60 year old married white female who comes in today for  evaluation of abdominal pain.   The patient was seen this morning by Dr. Fabian Sharp for evaluation of abdominal  pain. She states she took Zelnorm on Tuesday and immediately began having  bloating and abdominal pain. Dr. Fabian Sharp saw her for Dr. Clent Ridges and sent her to  [facility] for a CT scan of her abdomen with oral contrast. She comes back in Dr.  Rosezella Florida absence to see me for evaluation of the study and disposition.   The patient's had repeated bouts of diverticulitis. She says this is  probably her 10th episode. Her sigmoid colon is not able to be imaged. They  have tried sedation with colonoscopy, they have tried barium enema and  apparently she has a very scarred down tortuous colon. She also has a  history of IBS, interstitial cystitis, hyperlipidemia, a C7 radiculopathy,  allergic rhinitis, history of allergy, history of DVT in the past,  depression anxiety and chronic throat pain.   PAST MEDICAL HISTORY:  Has otherwise been hospitalized x2 for children.   PAST ILLNESSES AND INJURIES:  Other than above negative.   ALLERGIES:  1.  IODINE gives her an anaphylactic reaction.  2.  NUBAIN makes her nauseated.  3.  DEMEROL makes her vomit and get agitated.   She does not smoke or drink any alcohol.   CURRENT MEDICATIONS:  1.  Lipitor 10 q.h.s.  2.  Premphase.  3.  Tums.  4.  She also takes Xanax p.r.n., albuterol p.r.n. and Sonata p.r.n.   REVIEW OF SYMPTOMS:  HEENT:   Negative. She gets regular dental care.  CARDIOPULMONARY:  Negative except for the asthma control and occasional  __________. GI:  As noted above. She has  had numerous GI evaluations. Her  GI is Barbette Hair. Arlyce Dice, M.D. GU:  Interstitial cystitis as noted above  evaluated and treated by Jamison Neighbor, M.D. GYN:  Rhett Bannister 2, para 2. She  is on Premphase. She had a DVT in her 57's. Had a workup for clotting  abnormalities that was negative therefore was put on HRT. She has a history  of a bulging disk C6-7. Wool and spring allergies, otherwise review of  systems negative.   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 Married for 25 years, two children.   FAMILY HISTORY:  Dad has had bypass x5, hypertension, bone cancer. Mother  has hypertension, glaucoma, adult onset diabetes. One brother whose 4 years  older and has bypass x5 and hypertension.   VACCINATION  HISTORY:  Up to date. She had her last DT in 1998.   PHYSICAL EXAMINATION:  VITAL SIGNS:  Weight was 144, temperature 99.3, pulse  72 and regular, respirations 12 and regular, BP 120/72.  GENERAL:  She is a well-developed, well-nourished, slightly overweight white  female in acute distress with severe abdominal pain.  HEENT:  Negative.  NECK:  Supple, thyroid not enlarged.  CHEST:  Clear to auscultation.  CARDIAC:  Negative.  BREAST:  Deferred.  ABDOMEN:  Abdomen appeared to be bloated and she confirmed it is bloated to  her. Four quadrant auscultation revealed bowel sounds may be present. She  has severe tenderness of her abdomen with coughing and she has got rebound  and point tenderness.  EXTREMITIES:  Normal skin. Peripheral pulses normal.   CT scan of the abdomen showed sigmoid diverticulitis.   Because of her severe pain, bloating and diffuse tenderness which would  indicate acute peritonitis, she was admitted to the hospital for IV  treatment.    IMPRESSION:  1.  Acute diverticulitis.  2.  History of irritable bowel syndrome, constipation type.  3.  History of interstitial cystitis.  4.  C7 radiculopathy.  5.  Allergic rhinitis.  6.  Allergic asthma.  7.  History of deep venous thrombosis in the past, will prophylax with      heparin.  8.  History of depression and anxiety.  9.  History of chronic facial pain, etiology unknown.  10. Cholecystectomy via [place]. Daphine Deutscher, M.D.  11. History of hyperlipidemia.  12. Postmenopausal currently on hormone replacement therapy.   The patient will be admitted as noted above, made n.p.o. and started on IV  fluids, IV antibiotics. Followup as needed by hospital team.       JAT/MEDQ  D:  05/08/2005  T:  05/09/2005  Job:  [id number]

## 2011-07-13 ENCOUNTER — Emergency Department: Payer: Self-pay | Admitting: Emergency Medicine

## 2011-07-18 ENCOUNTER — Other Ambulatory Visit: Payer: Self-pay | Admitting: Family Medicine

## 2011-12-16 ENCOUNTER — Other Ambulatory Visit (INDEPENDENT_AMBULATORY_CARE_PROVIDER_SITE_OTHER): Payer: BC Managed Care – PPO

## 2011-12-16 DIAGNOSIS — Z0000 Encounter for general adult medical examination without abnormal findings: Secondary | ICD-10-CM

## 2011-12-16 LAB — CBC WITH DIFFERENTIAL/PLATELET
Basophils Absolute: 0 10*3/uL (ref 0.0–0.1)
MCV: 93.2 fl (ref 78.0–100.0)
Monocytes Absolute: 0.5 10*3/uL (ref 0.1–1.0)
Platelets: 249 10*3/uL (ref 150.0–400.0)
RBC: 4.32 Mil/uL (ref 3.87–5.11)

## 2011-12-16 LAB — HEMOGLOBIN A1C: Hgb A1c MFr Bld: 5.9 % (ref 4.6–6.5)

## 2011-12-16 LAB — BASIC METABOLIC PANEL
BUN: 14 mg/dL (ref 6–23)
CO2: 28 mEq/L (ref 19–32)
Chloride: 104 mEq/L (ref 96–112)
Creatinine, Ser: 0.8 mg/dL (ref 0.4–1.2)
Glucose, Bld: 76 mg/dL (ref 70–99)
Potassium: 3.8 mEq/L (ref 3.5–5.1)

## 2011-12-16 LAB — LIPID PANEL
Cholesterol: 186 mg/dL (ref 0–200)
HDL: 50.8 mg/dL (ref >39.00)
LDL Cholesterol: 120 mg/dL — ABNORMAL HIGH (ref 0–99)
Triglycerides: 77 mg/dL (ref 0.0–149.0)

## 2011-12-16 LAB — MICROALBUMIN / CREATININE URINE RATIO: Microalb, Ur: 1.5 mg/dL (ref 0.0–1.9)

## 2011-12-16 LAB — HEPATIC FUNCTION PANEL
ALT: 18 U/L (ref 0–35)
AST: 18 U/L (ref 0–37)
Alkaline Phosphatase: 77 U/L (ref 39–117)
Total Bilirubin: 0.8 mg/dL (ref 0.3–1.2)
Total Protein: 7.2 g/dL (ref 6.0–8.3)

## 2011-12-16 LAB — POCT URINALYSIS DIPSTICK
Nitrite, UA: NEGATIVE
Urobilinogen, UA: 0.2

## 2011-12-16 LAB — TSH: TSH: 2.25 u[IU]/mL (ref 0.35–5.50)

## 2011-12-22 ENCOUNTER — Encounter: Payer: Self-pay | Admitting: Family Medicine

## 2011-12-23 ENCOUNTER — Encounter: Payer: Self-pay | Admitting: Family Medicine

## 2011-12-23 ENCOUNTER — Ambulatory Visit (INDEPENDENT_AMBULATORY_CARE_PROVIDER_SITE_OTHER): Payer: BC Managed Care – PPO | Admitting: Family Medicine

## 2011-12-23 VITALS — BP 122/78 | HR 87 | Temp 98.3°F | Ht 60.5 in | Wt 145.0 lb

## 2011-12-23 DIAGNOSIS — Z0000 Encounter for general adult medical examination without abnormal findings: Secondary | ICD-10-CM

## 2011-12-23 MED ORDER — ZALEPLON 5 MG PO CAPS: 5.0000 mg | ORAL_CAPSULE | Freq: Every day | ORAL | Status: DC

## 2011-12-23 MED ORDER — ATORVASTATIN CALCIUM 10 MG PO TABS: 10.0000 mg | ORAL_TABLET | Freq: Every day | ORAL | Status: DC

## 2011-12-23 MED ORDER — ALBUTEROL SULFATE HFA 108 (90 BASE) MCG/ACT IN AERS: 2.0000 | INHALATION_SPRAY | RESPIRATORY_TRACT | Status: DC | PRN

## 2011-12-23 NOTE — Progress Notes (Signed)
Quick Note:  Pt aware ______ 

## 2011-12-23 NOTE — Progress Notes (Signed)
  Subjective:    Patient ID: Shelley Jones, female    DOB: 1950-12-13, 61 y.o.   MRN: [id number]  HPI 61 yr old female for a cpx. She feels fine with no concerns. She is dieting and has lost 7 lbs in the past 2 months.    Review of Systems  Constitutional: Negative.   HENT: Negative.   Eyes: Negative.   Respiratory: Negative.   Cardiovascular: Negative.   Gastrointestinal: Negative.   Genitourinary: Negative for dysuria, urgency, frequency, hematuria, flank pain, decreased urine volume, enuresis, difficulty urinating, pelvic pain and dyspareunia.  Musculoskeletal: Negative.   Skin: Negative.   Neurological: Negative.   Hematological: Negative.   Psychiatric/Behavioral: Negative.        Objective:   Physical Exam  Constitutional: She is oriented to person, place, and time. She appears well-developed and well-nourished. No distress.  HENT:  Head: Normocephalic and atraumatic.  Right Ear: External ear normal.  Left Ear: External ear normal.  Nose: Nose normal.  Mouth/Throat: Oropharynx is clear and moist. No oropharyngeal exudate.  Eyes: Conjunctivae and EOM are normal. Pupils are equal, round, and reactive to light. No scleral icterus.  Neck: Normal range of motion. Neck supple. No JVD present. No thyromegaly present.  Cardiovascular: Normal rate, regular rhythm, normal heart sounds and intact distal pulses.  Exam reveals no gallop and no friction rub.   No murmur heard.      EKG normal   Pulmonary/Chest: Effort normal and breath sounds normal. No respiratory distress. She has no wheezes. She has no rales. She exhibits no tenderness.  Abdominal: Soft. Bowel sounds are normal. She exhibits no distension and no mass. There is no tenderness. There is no rebound and no guarding.  Musculoskeletal: Normal range of motion. She exhibits no edema and no tenderness.  Lymphadenopathy:    She has no cervical adenopathy.  Neurological: She is alert and oriented to person, place, and time.  She has normal reflexes. No cranial nerve deficit. She exhibits normal muscle tone. Coordination normal.  Skin: Skin is warm and dry. No rash noted. No erythema.  Psychiatric: She has a normal mood and affect. Her behavior is normal. Judgment and thought content normal.          Assessment & Plan:  Well exam. Meds refilled.

## 2011-12-31 ENCOUNTER — Other Ambulatory Visit: Payer: Self-pay | Admitting: Family Medicine

## 2012-11-22 ENCOUNTER — Encounter: Payer: Self-pay | Admitting: Family Medicine

## 2012-11-22 ENCOUNTER — Ambulatory Visit (INDEPENDENT_AMBULATORY_CARE_PROVIDER_SITE_OTHER): Payer: BC Managed Care – PPO | Admitting: Family Medicine

## 2012-11-22 VITALS — BP 120/80 | HR 90 | Temp 98.8°F | Wt 140.0 lb

## 2012-11-22 DIAGNOSIS — K5732 Diverticulitis of large intestine without perforation or abscess without bleeding: Secondary | ICD-10-CM

## 2012-11-22 MED ORDER — FLAGYL 500 MG PO TABS: 500.0000 mg | ORAL_TABLET | Freq: Three times a day (TID) | ORAL | Status: DC

## 2012-11-22 MED ORDER — CIPRO 500 MG PO TABS: 500.0000 mg | ORAL_TABLET | Freq: Two times a day (BID) | ORAL | Status: DC

## 2012-11-22 MED ORDER — BENTYL 20 MG PO TABS: 20.0000 mg | ORAL_TABLET | Freq: Four times a day (QID) | ORAL | Status: DC | PRN

## 2012-11-22 NOTE — Progress Notes (Signed)
  Subjective:    Patient ID: Shelley Jones, female    DOB: 06-20-1951, 62 y.o.   MRN: [id number]  HPI Here for 3 days of abdominal cramps and LLQ pains. She has been a little constipated. Her last bout of diverticulitis was 3 years ago. No fever. No nausea.    Review of Systems  Constitutional: Negative.   Gastrointestinal: Positive for abdominal pain, constipation and abdominal distention. Negative for nausea, vomiting, diarrhea, blood in stool and rectal pain.  Genitourinary: Negative.        Objective:   Physical Exam  Constitutional: She appears well-developed and well-nourished.  Abdominal: Soft. Bowel sounds are normal. She exhibits no distension and no mass. There is no rebound and no guarding.       Tender in the LLQ          Assessment & Plan:  Drink lots of water. Given Flagyl and Cipro. Use Bentyl for cramps

## 2012-12-16 ENCOUNTER — Other Ambulatory Visit (INDEPENDENT_AMBULATORY_CARE_PROVIDER_SITE_OTHER): Payer: BC Managed Care – PPO

## 2012-12-16 DIAGNOSIS — Z0000 Encounter for general adult medical examination without abnormal findings: Secondary | ICD-10-CM

## 2012-12-16 LAB — POCT URINALYSIS DIPSTICK
Bilirubin, UA: NEGATIVE
Glucose, UA: NEGATIVE
Protein, UA: NEGATIVE
Urobilinogen, UA: 0.2
pH, UA: 5.5

## 2012-12-16 LAB — HEPATIC FUNCTION PANEL
AST: 17 U/L (ref 0–37)
Alkaline Phosphatase: 76 U/L (ref 39–117)
Bilirubin, Direct: 0 mg/dL (ref 0.0–0.3)
Total Bilirubin: 0.7 mg/dL (ref 0.3–1.2)
Total Protein: 7.1 g/dL (ref 6.0–8.3)

## 2012-12-16 LAB — BASIC METABOLIC PANEL
BUN: 16 mg/dL (ref 6–23)
Calcium: 9.6 mg/dL (ref 8.4–10.5)
Chloride: 107 mEq/L (ref 96–112)
Creatinine, Ser: 0.8 mg/dL (ref 0.4–1.2)

## 2012-12-16 LAB — LDL CHOLESTEROL, DIRECT: Direct LDL: 205.5 mg/dL

## 2012-12-16 LAB — HEMOGLOBIN A1C: Hgb A1c MFr Bld: 5.7 % (ref 4.6–6.5)

## 2012-12-16 LAB — CBC WITH DIFFERENTIAL/PLATELET
Basophils Absolute: 0.1 10*3/uL (ref 0.0–0.1)
Eosinophils Absolute: 0.4 10*3/uL (ref 0.0–0.7)
Lymphocytes Relative: 33.8 % (ref 12.0–46.0)
Lymphs Abs: 2.3 10*3/uL (ref 0.7–4.0)
MCHC: 33.7 g/dL (ref 30.0–36.0)
Monocytes Absolute: 0.6 10*3/uL (ref 0.1–1.0)
Neutrophils Relative %: 51.2 % (ref 43.0–77.0)
RDW: 13.2 % (ref 11.5–14.6)

## 2012-12-16 LAB — MICROALBUMIN / CREATININE URINE RATIO
Creatinine,U: 123.6 mg/dL
Microalb Creat Ratio: 0.4 mg/g (ref 0.0–30.0)

## 2012-12-16 LAB — LIPID PANEL
Total CHOL/HDL Ratio: 6
Triglycerides: 92 mg/dL (ref 0.0–149.0)
VLDL: 18.4 mg/dL (ref 0.0–40.0)

## 2012-12-16 LAB — TSH: TSH: 1.45 u[IU]/mL (ref 0.35–5.50)

## 2012-12-17 MED ORDER — ATORVASTATIN CALCIUM 40 MG PO TABS: 40.0000 mg | ORAL_TABLET | Freq: Every day | ORAL | Status: DC

## 2012-12-17 NOTE — Progress Notes (Signed)
Quick Note:  I tried to reach pt by phone, no answer. I sent script to [facility] and put a copy of results in mail to pt. ______

## 2012-12-23 ENCOUNTER — Ambulatory Visit (INDEPENDENT_AMBULATORY_CARE_PROVIDER_SITE_OTHER): Payer: BC Managed Care – PPO | Admitting: Family Medicine

## 2012-12-23 ENCOUNTER — Encounter: Payer: Self-pay | Admitting: Family Medicine

## 2012-12-23 VITALS — BP 120/70 | HR 66 | Temp 98.6°F | Ht 61.0 in | Wt 146.0 lb

## 2012-12-23 DIAGNOSIS — Z0000 Encounter for general adult medical examination without abnormal findings: Secondary | ICD-10-CM

## 2012-12-23 MED ORDER — ATORVASTATIN CALCIUM 10 MG PO TABS: 10.0000 mg | ORAL_TABLET | Freq: Every day | ORAL | Status: DC

## 2012-12-23 NOTE — Progress Notes (Signed)
  Subjective:    Patient ID: Shelley Jones, female    DOB: May 18, 1951, 62 y.o.   MRN: [id number]  HPI 62 yr old female for a cpx. She feels well and has no concerns. When we saw her recently for abdominal pain, we prescribed antibiotics but she waited 3 days before she got these filled. Instead she took Bentyl and she had immediate complete relief of her pain. She ended up taking several days of antibiotics but the stopped. She has felt fine ever since. Apparently this was simply an episode of bowel spasms and not really diverticulitis. Also she admits to stopping her Lipitor 10 mg almost a year ago, and this accounts for why her LDL shot back up.    Review of Systems  Constitutional: Negative.   HENT: Negative.   Eyes: Negative.   Respiratory: Negative.   Cardiovascular: Negative.   Gastrointestinal: Negative.   Genitourinary: Negative for dysuria, urgency, frequency, hematuria, flank pain, decreased urine volume, enuresis, difficulty urinating, pelvic pain and dyspareunia.  Musculoskeletal: Negative.   Skin: Negative.   Neurological: Negative.   Psychiatric/Behavioral: Negative.        Objective:   Physical Exam  Constitutional: She is oriented to person, place, and time. She appears well-developed and well-nourished. No distress.  HENT:  Head: Normocephalic and atraumatic.  Right Ear: External ear normal.  Left Ear: External ear normal.  Nose: Nose normal.  Mouth/Throat: Oropharynx is clear and moist. No oropharyngeal exudate.  Eyes: Conjunctivae and EOM are normal. Pupils are equal, round, and reactive to light. No scleral icterus.  Neck: Normal range of motion. Neck supple. No JVD present. No thyromegaly present.  Cardiovascular: Normal rate, regular rhythm, normal heart sounds and intact distal pulses.  Exam reveals no gallop and no friction rub.   No murmur heard. EKG normal   Pulmonary/Chest: Effort normal and breath sounds normal. No respiratory distress. She has no  wheezes. She has no rales. She exhibits no tenderness.  Abdominal: Soft. Bowel sounds are normal. She exhibits no distension and no mass. There is no tenderness. There is no rebound and no guarding.  Musculoskeletal: Normal range of motion. She exhibits no edema and no tenderness.  Lymphadenopathy:    She has no cervical adenopathy.  Neurological: She is alert and oriented to person, place, and time. She has normal reflexes. No cranial nerve deficit. She exhibits normal muscle tone. Coordination normal.  Skin: Skin is warm and dry. No rash noted. No erythema.  Psychiatric: She has a normal mood and affect. Her behavior is normal. Judgment and thought content normal.          Assessment & Plan:  Well exam. Use Bentyl prn. Get back on Lipitor 10 mg a day.

## 2013-02-28 ENCOUNTER — Telehealth: Payer: Self-pay | Admitting: Family Medicine

## 2013-02-28 ENCOUNTER — Other Ambulatory Visit: Payer: Self-pay | Admitting: Family Medicine

## 2013-02-28 MED ORDER — ALBUTEROL SULFATE HFA 108 (90 BASE) MCG/ACT IN AERS: 2.0000 | INHALATION_SPRAY | RESPIRATORY_TRACT | Status: DC | PRN

## 2013-02-28 NOTE — Telephone Encounter (Signed)
PT called and stated that her Proair inhaler isn't working well. She stated that she needs a Ventolin inhaler, because that is the only thing that's helping her. She's complaining of SOB, and stated that she already left a message with the triage nurse 4 hours ago. Please assist.

## 2013-02-28 NOTE — Telephone Encounter (Signed)
I sent script e-scribe and spoke with pt. 

## 2013-02-28 NOTE — Telephone Encounter (Signed)
Switch her to Ventolin HFA (name brand only) to use 2 puffs q 4 hours prn, #1 with 11 rf

## 2013-12-14 ENCOUNTER — Other Ambulatory Visit (INDEPENDENT_AMBULATORY_CARE_PROVIDER_SITE_OTHER): Payer: BC Managed Care – PPO

## 2013-12-14 DIAGNOSIS — Z0000 Encounter for general adult medical examination without abnormal findings: Secondary | ICD-10-CM

## 2013-12-14 DIAGNOSIS — Z833 Family history of diabetes mellitus: Secondary | ICD-10-CM

## 2013-12-14 LAB — CBC WITH DIFFERENTIAL/PLATELET
Basophils Absolute: 0.1 10*3/uL (ref 0.0–0.1)
Basophils Relative: 0.7 % (ref 0.0–3.0)
EOS PCT: 6 % — AB (ref 0.0–5.0)
Eosinophils Absolute: 0.4 10*3/uL (ref 0.0–0.7)
HEMATOCRIT: 42.1 % (ref 36.0–46.0)
HEMOGLOBIN: 13.5 g/dL (ref 12.0–15.0)
LYMPHS ABS: 2.1 10*3/uL (ref 0.7–4.0)
LYMPHS PCT: 29.1 % (ref 12.0–46.0)
MCHC: 32.1 g/dL (ref 30.0–36.0)
MCV: 93.8 fl (ref 78.0–100.0)
Monocytes Absolute: 0.7 10*3/uL (ref 0.1–1.0)
Monocytes Relative: 9.8 % (ref 3.0–12.0)
Neutro Abs: 3.8 10*3/uL (ref 1.4–7.7)
Neutrophils Relative %: 54.4 % (ref 43.0–77.0)
Platelets: 297 10*3/uL (ref 150.0–400.0)
RBC: 4.49 Mil/uL (ref 3.87–5.11)
RDW: 13.2 % (ref 11.5–14.6)
WBC: 7.1 10*3/uL (ref 4.5–10.5)

## 2013-12-14 LAB — BASIC METABOLIC PANEL
BUN: 10 mg/dL (ref 6–23)
CO2: 28 mEq/L (ref 19–32)
Calcium: 9.6 mg/dL (ref 8.4–10.5)
Chloride: 105 mEq/L (ref 96–112)
Creatinine, Ser: 0.9 mg/dL (ref 0.4–1.2)
GFR: 71.06 mL/min (ref >60.00)
Glucose, Bld: 84 mg/dL (ref 70–99)
POTASSIUM: 4.3 meq/L (ref 3.5–5.1)
SODIUM: 139 meq/L (ref 135–145)

## 2013-12-14 LAB — HEMOGLOBIN A1C: Hgb A1c MFr Bld: 5.7 % (ref 4.6–6.5)

## 2013-12-14 LAB — HEPATIC FUNCTION PANEL
ALK PHOS: 75 U/L (ref 39–117)
ALT: 19 U/L (ref 0–35)
AST: 18 U/L (ref 0–37)
Albumin: 3.9 g/dL (ref 3.5–5.2)
BILIRUBIN DIRECT: 0 mg/dL (ref 0.0–0.3)
BILIRUBIN TOTAL: 0.6 mg/dL (ref 0.3–1.2)
Total Protein: 7.2 g/dL (ref 6.0–8.3)

## 2013-12-14 LAB — POCT URINALYSIS DIPSTICK
Bilirubin, UA: NEGATIVE
Blood, UA: NEGATIVE
GLUCOSE UA: NEGATIVE
KETONES UA: NEGATIVE
NITRITE UA: NEGATIVE
Protein, UA: NEGATIVE
Spec Grav, UA: 1.02
Urobilinogen, UA: 0.2
pH, UA: 7

## 2013-12-14 LAB — LIPID PANEL
CHOLESTEROL: 264 mg/dL — AB (ref 0–200)
HDL: 48 mg/dL (ref >39.00)
Total CHOL/HDL Ratio: 6
Triglycerides: 162 mg/dL — ABNORMAL HIGH (ref 0.0–149.0)
VLDL: 32.4 mg/dL (ref 0.0–40.0)

## 2013-12-14 LAB — TSH: TSH: 2.34 u[IU]/mL (ref 0.35–5.50)

## 2013-12-14 LAB — LDL CHOLESTEROL, DIRECT: Direct LDL: 189.1 mg/dL

## 2013-12-15 ENCOUNTER — Other Ambulatory Visit: Payer: BC Managed Care – PPO

## 2013-12-26 ENCOUNTER — Encounter: Payer: Self-pay | Admitting: Family Medicine

## 2013-12-26 ENCOUNTER — Ambulatory Visit (INDEPENDENT_AMBULATORY_CARE_PROVIDER_SITE_OTHER): Payer: BC Managed Care – PPO | Admitting: Family Medicine

## 2013-12-26 VITALS — BP 110/70 | HR 68 | Temp 98.9°F | Ht 60.5 in | Wt 150.0 lb

## 2013-12-26 DIAGNOSIS — Z0000 Encounter for general adult medical examination without abnormal findings: Secondary | ICD-10-CM

## 2013-12-26 DIAGNOSIS — E785 Hyperlipidemia, unspecified: Secondary | ICD-10-CM

## 2013-12-26 MED ORDER — ZALEPLON 5 MG PO CAPS: 5.0000 mg | ORAL_CAPSULE | Freq: Every evening | ORAL | Status: DC | PRN

## 2013-12-26 MED ORDER — ALBUTEROL SULFATE HFA 108 (90 BASE) MCG/ACT IN AERS: 2.0000 | INHALATION_SPRAY | RESPIRATORY_TRACT | Status: DC | PRN

## 2013-12-26 MED ORDER — ROSUVASTATIN CALCIUM 10 MG PO TABS: 10.0000 mg | ORAL_TABLET | Freq: Every day | ORAL | Status: DC

## 2013-12-26 NOTE — Progress Notes (Signed)
   Subjective:    Patient ID: Shelley Jones, female    DOB: 1951-05-19, 63 y.o.   MRN: [id number]  HPI 63 yr old female for a cpx. She feels well. She had stopped taking Lipitor due to muscle aches but she is willing to try another agent to get her lipids down.    Review of Systems  Constitutional: Negative.   HENT: Negative.   Eyes: Negative.   Respiratory: Negative.   Cardiovascular: Negative.   Gastrointestinal: Negative.   Genitourinary: Negative for dysuria, urgency, frequency, hematuria, flank pain, decreased urine volume, enuresis, difficulty urinating, pelvic pain and dyspareunia.  Musculoskeletal: Negative.   Skin: Negative.   Neurological: Negative.   Psychiatric/Behavioral: Negative.        Objective:   Physical Exam  Constitutional: She is oriented to person, place, and time. She appears well-developed and well-nourished. No distress.  HENT:  Head: Normocephalic and atraumatic.  Right Ear: External ear normal.  Left Ear: External ear normal.  Nose: Nose normal.  Mouth/Throat: Oropharynx is clear and moist. No oropharyngeal exudate.  Eyes: Conjunctivae and EOM are normal. Pupils are equal, round, and reactive to light. No scleral icterus.  Neck: Normal range of motion. Neck supple. No JVD present. No thyromegaly present.  Cardiovascular: Normal rate, regular rhythm, normal heart sounds and intact distal pulses.  Exam reveals no gallop and no friction rub.   No murmur heard. EKG normal   Pulmonary/Chest: Effort normal and breath sounds normal. No respiratory distress. She has no wheezes. She has no rales. She exhibits no tenderness.  Abdominal: Soft. Bowel sounds are normal. She exhibits no distension and no mass. There is no tenderness. There is no rebound and no guarding.  Musculoskeletal: Normal range of motion. She exhibits no edema and no tenderness.  Lymphadenopathy:    She has no cervical adenopathy.  Neurological: She is alert and oriented to person,  place, and time. She has normal reflexes. No cranial nerve deficit. She exhibits normal muscle tone. Coordination normal.  Skin: Skin is warm and dry. No rash noted. No erythema.  Psychiatric: She has a normal mood and affect. Her behavior is normal. Judgment and thought content normal.          Assessment & Plan:  Well exam. Try Crestor 10 mg daily.

## 2014-05-12 ENCOUNTER — Encounter: Payer: Self-pay | Admitting: Family Medicine

## 2014-05-12 ENCOUNTER — Ambulatory Visit (INDEPENDENT_AMBULATORY_CARE_PROVIDER_SITE_OTHER): Payer: BC Managed Care – PPO | Admitting: Family Medicine

## 2014-05-12 ENCOUNTER — Ambulatory Visit: Payer: BC Managed Care – PPO | Admitting: Internal Medicine

## 2014-05-12 VITALS — BP 141/77 | HR 58 | Temp 99.1°F | Ht 60.5 in | Wt 150.0 lb

## 2014-05-12 DIAGNOSIS — B029 Zoster without complications: Secondary | ICD-10-CM

## 2014-05-12 MED ORDER — METHYLPREDNISOLONE 4 MG PO KIT: PACK | ORAL | Status: DC

## 2014-05-12 MED ORDER — VALACYCLOVIR HCL 1 G PO TABS: 1000.0000 mg | ORAL_TABLET | Freq: Three times a day (TID) | ORAL | Status: DC

## 2014-05-12 NOTE — Progress Notes (Signed)
   Subjective:    Patient ID: Shelley Jones, female    DOB: 10-Aug-1951, 63 y.o.   MRN: [id number]  HPI Here for 6 days of a burning superficial pain in the left abdomen and left flank just beneath the breast. The skin is sensitive to the touch. No visible rash. She has had shingles twice before.    Review of Systems  Constitutional: Negative.   Respiratory: Negative.   Cardiovascular: Negative.   Gastrointestinal: Positive for abdominal pain. Negative for nausea, vomiting, diarrhea, constipation, blood in stool and abdominal distention.  Skin: Negative.        Objective:   Physical Exam  Constitutional: She appears well-developed and well-nourished.  Abdominal: Soft. Bowel sounds are normal. She exhibits no distension and no mass. There is no tenderness. There is no rebound and no guarding.  Skin: No rash noted. No erythema.          Assessment & Plan:  Early shingles. Treat with Valtrex and a Medrol dose pack. Advised her that she will likely develop a rash in this area over the next few days

## 2014-05-12 NOTE — Progress Notes (Signed)
Pre visit review using our clinic review tool, if applicable. No additional management support is needed unless otherwise documented below in the visit note. 

## 2014-11-06 ENCOUNTER — Emergency Department: Payer: Self-pay | Admitting: Emergency Medicine

## 2014-12-22 ENCOUNTER — Other Ambulatory Visit (INDEPENDENT_AMBULATORY_CARE_PROVIDER_SITE_OTHER): Payer: BC Managed Care – PPO

## 2014-12-22 DIAGNOSIS — Z0000 Encounter for general adult medical examination without abnormal findings: Secondary | ICD-10-CM

## 2014-12-22 LAB — CBC WITH DIFFERENTIAL/PLATELET
BASOS ABS: 0.1 10*3/uL (ref 0.0–0.1)
Basophils Relative: 1 % (ref 0.0–3.0)
EOS ABS: 0.4 10*3/uL (ref 0.0–0.7)
Eosinophils Relative: 5.4 % — ABNORMAL HIGH (ref 0.0–5.0)
HCT: 40.9 % (ref 36.0–46.0)
HEMOGLOBIN: 13.8 g/dL (ref 12.0–15.0)
Lymphocytes Relative: 34 % (ref 12.0–46.0)
Lymphs Abs: 2.7 10*3/uL (ref 0.7–4.0)
MCHC: 33.8 g/dL (ref 30.0–36.0)
MCV: 89.3 fl (ref 78.0–100.0)
MONOS PCT: 7.5 % (ref 3.0–12.0)
Monocytes Absolute: 0.6 10*3/uL (ref 0.1–1.0)
NEUTROS PCT: 52.1 % (ref 43.0–77.0)
Neutro Abs: 4.1 10*3/uL (ref 1.4–7.7)
Platelets: 270 10*3/uL (ref 150.0–400.0)
RBC: 4.58 Mil/uL (ref 3.87–5.11)
RDW: 13.3 % (ref 11.5–15.5)
WBC: 8 10*3/uL (ref 4.0–10.5)

## 2014-12-22 LAB — POCT URINALYSIS DIPSTICK
BILIRUBIN UA: NEGATIVE
Blood, UA: NEGATIVE
GLUCOSE UA: NEGATIVE
Ketones, UA: NEGATIVE
NITRITE UA: NEGATIVE
Protein, UA: NEGATIVE
SPEC GRAV UA: 1.01
Urobilinogen, UA: 0.2
pH, UA: 5.5

## 2014-12-22 LAB — LIPID PANEL
CHOL/HDL RATIO: 5
CHOLESTEROL: 238 mg/dL — AB (ref 0–200)
HDL: 47.2 mg/dL (ref >39.00)
LDL CALC: 166 mg/dL — AB (ref 0–99)
NonHDL: 190.8
TRIGLYCERIDES: 124 mg/dL (ref 0.0–149.0)
VLDL: 24.8 mg/dL (ref 0.0–40.0)

## 2014-12-22 LAB — BASIC METABOLIC PANEL
BUN: 15 mg/dL (ref 6–23)
CALCIUM: 9.6 mg/dL (ref 8.4–10.5)
CHLORIDE: 106 meq/L (ref 96–112)
CO2: 30 meq/L (ref 19–32)
Creatinine, Ser: 0.94 mg/dL (ref 0.40–1.20)
GFR: 63.91 mL/min (ref >60.00)
Glucose, Bld: 96 mg/dL (ref 70–99)
Potassium: 4.5 mEq/L (ref 3.5–5.1)
Sodium: 140 mEq/L (ref 135–145)

## 2014-12-22 LAB — HEPATIC FUNCTION PANEL
ALK PHOS: 80 U/L (ref 39–117)
ALT: 17 U/L (ref 0–35)
AST: 17 U/L (ref 0–37)
Albumin: 4 g/dL (ref 3.5–5.2)
BILIRUBIN TOTAL: 0.6 mg/dL (ref 0.2–1.2)
Bilirubin, Direct: 0.1 mg/dL (ref 0.0–0.3)
Total Protein: 7.2 g/dL (ref 6.0–8.3)

## 2014-12-22 LAB — TSH: TSH: 3.12 u[IU]/mL (ref 0.35–4.50)

## 2014-12-29 ENCOUNTER — Encounter: Payer: Self-pay | Admitting: Family Medicine

## 2014-12-29 ENCOUNTER — Encounter: Payer: BC Managed Care – PPO | Admitting: Family Medicine

## 2014-12-29 ENCOUNTER — Ambulatory Visit (INDEPENDENT_AMBULATORY_CARE_PROVIDER_SITE_OTHER): Payer: BC Managed Care – PPO | Admitting: Family Medicine

## 2014-12-29 VITALS — BP 136/81 | HR 59 | Temp 98.8°F | Ht 60.75 in | Wt 150.0 lb

## 2014-12-29 DIAGNOSIS — Z0000 Encounter for general adult medical examination without abnormal findings: Secondary | ICD-10-CM

## 2014-12-29 MED ORDER — ALBUTEROL SULFATE HFA 108 (90 BASE) MCG/ACT IN AERS
2.0000 | INHALATION_SPRAY | RESPIRATORY_TRACT | Status: DC | PRN
Start: 2014-12-29 — End: 2015-12-31

## 2014-12-29 MED ORDER — ESOMEPRAZOLE MAGNESIUM 20 MG PO CPDR: 20.0000 mg | DELAYED_RELEASE_CAPSULE | Freq: Every day | ORAL | Status: DC

## 2014-12-29 MED ORDER — VALACYCLOVIR HCL 500 MG PO TABS: 500.0000 mg | ORAL_TABLET | Freq: Two times a day (BID) | ORAL | Status: DC | PRN

## 2014-12-29 MED ORDER — ROSUVASTATIN CALCIUM 10 MG PO TABS: 10.0000 mg | ORAL_TABLET | Freq: Every day | ORAL | Status: DC

## 2014-12-29 MED ORDER — HYDROXYZINE HCL 10 MG PO TABS: 10.0000 mg | ORAL_TABLET | Freq: Every day | ORAL | Status: DC

## 2014-12-29 NOTE — Progress Notes (Signed)
   Subjective:    Patient ID: Shelley Jones, female    DOB: 05-28-1951, 64 y.o.   MRN: [id number]  HPI 64 yr old female for a cpx. Her main complaint today is recurrent epigastric pain after she eats, and she has heartburn at times. No nausea, no trouble swallowing.    Review of Systems  Constitutional: Negative.   HENT: Negative.   Eyes: Negative.   Respiratory: Negative.   Cardiovascular: Negative.   Gastrointestinal: Negative.   Genitourinary: Negative for dysuria, urgency, frequency, hematuria, flank pain, decreased urine volume, enuresis, difficulty urinating, pelvic pain and dyspareunia.  Musculoskeletal: Negative.   Skin: Negative.   Neurological: Negative.   Psychiatric/Behavioral: Negative.        Objective:   Physical Exam  Constitutional: She is oriented to person, place, and time. She appears well-developed and well-nourished. No distress.  HENT:  Head: Normocephalic and atraumatic.  Right Ear: External ear normal.  Left Ear: External ear normal.  Nose: Nose normal.  Mouth/Throat: Oropharynx is clear and moist. No oropharyngeal exudate.  Eyes: Conjunctivae and EOM are normal. Pupils are equal, round, and reactive to light. No scleral icterus.  Neck: Normal range of motion. Neck supple. No JVD present. No thyromegaly present.  Cardiovascular: Normal rate, regular rhythm, normal heart sounds and intact distal pulses.  Exam reveals no gallop and no friction rub.   No murmur heard. EKG normal   Pulmonary/Chest: Effort normal and breath sounds normal. No respiratory distress. She has no wheezes. She has no rales. She exhibits no tenderness.  Abdominal: Soft. Bowel sounds are normal. She exhibits no distension and no mass. There is no tenderness. There is no rebound and no guarding.  Musculoskeletal: Normal range of motion. She exhibits no edema or tenderness.  Lymphadenopathy:    She has no cervical adenopathy.  Neurological: She is alert and oriented to person,  place, and time. She has normal reflexes. No cranial nerve deficit. She exhibits normal muscle tone. Coordination normal.  Skin: Skin is warm and dry. No rash noted. No erythema.  Psychiatric: She has a normal mood and affect. Her behavior is normal. Judgment and thought content normal.          Assessment & Plan:  Well exam. Try Nexium OTC daily for the GERD.

## 2014-12-29 NOTE — Progress Notes (Signed)
Pre visit review using our clinic review tool, if applicable. No additional management support is needed unless otherwise documented below in the visit note. 

## 2015-01-03 ENCOUNTER — Encounter: Payer: BC Managed Care – PPO | Admitting: Family Medicine

## 2015-01-06 ENCOUNTER — Telehealth: Payer: Self-pay | Admitting: Family Medicine

## 2015-01-06 NOTE — Telephone Encounter (Signed)
Called to see how patient was feeling today and to see if she went to the ER.  Left a message on pt's cell phone.

## 2015-01-06 NOTE — Telephone Encounter (Signed)
[facility] Night - Client [facility] Patient Name: Shelley Jones DOB: 04/04/1951 Initial Comment Caller states when she eats she feels full and belches and nauseous and feels pain when the food is going down her chest after swallowing. Nurse Assessment Nurse: Donalynn Furlong, RN, Myna Hidalgo Date/Time Eilene Ghazi Time): 01/06/2015 9:24:14 AM Confirm and document reason for call. If symptomatic, describe symptoms. ---Caller states when she eats she feels full and belches and nauseous and feels pain when the food is going down her chest after swallowing. Has the patient traveled out of the country within the last 30 days? ---No Does the patient require triage? ---Yes Related visit to physician within the last 2 weeks? ---Yes Does the PT have any chronic conditions? (i.e. diabetes, asthma, etc.) ---Yes List chronic conditions. ---seasonal asthma , high blood pressure Guidelines Guideline Title Affirmed Question Affirmed Notes Abdominal Pain - Female [1] Vomiting AND [2] contains red blood or black ("coffee ground") material (Exception: few red streaks in vomit that only happened once) Final Disposition User Go to ED Now Donalynn Furlong, RN, Myna Hidalgo Comments Pain, constant belching, heard during entire call, with retching and vomiting blood heard towards the end of the call

## 2015-01-08 NOTE — Telephone Encounter (Signed)
I left a voice message, just calling to check on pt, advised her to follow up with Dr. Sarajane Jews as needed.

## 2015-01-09 ENCOUNTER — Ambulatory Visit (INDEPENDENT_AMBULATORY_CARE_PROVIDER_SITE_OTHER): Payer: BC Managed Care – PPO | Admitting: Family Medicine

## 2015-01-09 ENCOUNTER — Encounter: Payer: Self-pay | Admitting: Family Medicine

## 2015-01-09 ENCOUNTER — Telehealth: Payer: Self-pay | Admitting: Internal Medicine

## 2015-01-09 VITALS — BP 143/77 | HR 64 | Temp 98.7°F | Ht 60.75 in | Wt 150.0 lb

## 2015-01-09 DIAGNOSIS — K219 Gastro-esophageal reflux disease without esophagitis: Secondary | ICD-10-CM

## 2015-01-09 NOTE — Progress Notes (Signed)
Pre visit review using our clinic review tool, if applicable. No additional management support is needed unless otherwise documented below in the visit note. 

## 2015-01-09 NOTE — Progress Notes (Signed)
   Subjective:    Patient ID: Shelley Jones, female    DOB: 07-Apr-1951, 64 y.o.   MRN: [id number]  HPI Here for several recent episodes of epigastric pain after eating which were sharp and accompanied by some nausea. No chest pain or SOB. She saw a [facility] on 01-06-15 and they told her to take Nexium 20 mg OTC daily. She has done this  for the past few days and she has felt fine.    Review of Systems  Constitutional: Negative.   Respiratory: Negative.   Cardiovascular: Negative.   Gastrointestinal: Positive for nausea and abdominal pain. Negative for vomiting, diarrhea, constipation, blood in stool, abdominal distention and anal bleeding.       Objective:   Physical Exam  Constitutional: She appears well-developed and well-nourished.  Cardiovascular: Normal rate, regular rhythm, normal heart sounds and intact distal pulses.   Pulmonary/Chest: Effort normal and breath sounds normal.  Abdominal: Soft. Bowel sounds are normal. She exhibits no distension. There is no tenderness. There is no rebound and no guarding.          Assessment & Plan:  This sounds like she is having some esophageal spasms from GERD. I advised her to stay on the Nexium every day and return if this is not working. If not we would refer her to GI for possible EGD.

## 2015-01-09 NOTE — Telephone Encounter (Signed)
Pt states she has been having lots of abdominal pain, spasms, reflux, and dysphagia. States she has not been able to eat much lately either. Pt requests to be seen. Pt scheduled to see Alonza Bogus PA tomorrow at [facility]. Pt aware of appt.

## 2015-01-10 ENCOUNTER — Ambulatory Visit (INDEPENDENT_AMBULATORY_CARE_PROVIDER_SITE_OTHER): Payer: BC Managed Care – PPO | Admitting: Gastroenterology

## 2015-01-10 ENCOUNTER — Encounter: Payer: Self-pay | Admitting: Gastroenterology

## 2015-01-10 VITALS — BP 128/80 | HR 66 | Ht 60.75 in | Wt 150.4 lb

## 2015-01-10 DIAGNOSIS — K219 Gastro-esophageal reflux disease without esophagitis: Secondary | ICD-10-CM

## 2015-01-10 DIAGNOSIS — Z1211 Encounter for screening for malignant neoplasm of colon: Secondary | ICD-10-CM | POA: Diagnosis not present

## 2015-01-10 DIAGNOSIS — G8929 Other chronic pain: Secondary | ICD-10-CM

## 2015-01-10 DIAGNOSIS — R1013 Epigastric pain: Secondary | ICD-10-CM

## 2015-01-10 DIAGNOSIS — R1314 Dysphagia, pharyngoesophageal phase: Secondary | ICD-10-CM | POA: Diagnosis not present

## 2015-01-10 MED ORDER — PANTOPRAZOLE SODIUM 40 MG PO TBEC
40.0000 mg | DELAYED_RELEASE_TABLET | Freq: Every day | ORAL | Status: DC
Start: 2015-01-10 — End: 2015-02-19

## 2015-01-10 NOTE — Patient Instructions (Addendum)
You have been scheduled for an endoscopy. Please follow written instructions given to you at your visit today. If you use inhalers (even only as needed), please bring them with you on the day of your procedure. Your physician has requested that you go to www.startemmi.com and enter the access code given to you at your visit today. This web site gives a general overview about your procedure. However, you should still follow specific instructions given to you by our office regarding your preparation for the procedure. We have sent medications to your pharmacy for you to pick up at your convenience. You will receive a call from [employer] with information on your cologuard test. You have been scheduled for an abdominal ultrasound at [facility] (1st floor of hospital) on 01/17/15 at 930 am. Please arrive 15 minutes prior to your appointment for registration. Make certain not to have anything to eat or drink 6 hours prior to your appointment. Should you need to reschedule your appointment, please contact radiology at [phone]. This test typically takes about 30 minutes to perform. CC:  Alysia Penna MD

## 2015-01-12 DIAGNOSIS — R1314 Dysphagia, pharyngoesophageal phase: Secondary | ICD-10-CM | POA: Insufficient documentation

## 2015-01-12 DIAGNOSIS — R1013 Epigastric pain: Principal | ICD-10-CM

## 2015-01-12 DIAGNOSIS — Z1211 Encounter for screening for malignant neoplasm of colon: Secondary | ICD-10-CM | POA: Insufficient documentation

## 2015-01-12 DIAGNOSIS — G8929 Other chronic pain: Secondary | ICD-10-CM | POA: Insufficient documentation
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
## 2015-01-16 NOTE — Progress Notes (Signed)
Agree with initial assessment and plans 

## 2015-01-17 ENCOUNTER — Ambulatory Visit (HOSPITAL_COMMUNITY)
Admission: RE | Admit: 2015-01-17 | Discharge: 2015-01-17 | Disposition: A | Discharge status: Home / Self Care | Payer: BC Managed Care – PPO | Source: Ambulatory Visit | Attending: Gastroenterology | Admitting: Gastroenterology

## 2015-01-17 DIAGNOSIS — R109 Unspecified abdominal pain: Secondary | ICD-10-CM | POA: Diagnosis present

## 2015-01-17 DIAGNOSIS — R1013 Epigastric pain: Secondary | ICD-10-CM

## 2015-01-17 DIAGNOSIS — R932 Abnormal findings on diagnostic imaging of liver and biliary tract: Secondary | ICD-10-CM | POA: Diagnosis not present

## 2015-01-17 DIAGNOSIS — G8929 Other chronic pain: Secondary | ICD-10-CM

## 2015-01-17 DIAGNOSIS — Z9049 Acquired absence of other specified parts of digestive tract: Secondary | ICD-10-CM | POA: Insufficient documentation

## 2015-01-18 ENCOUNTER — Telehealth: Payer: Self-pay | Admitting: Internal Medicine

## 2015-01-18 NOTE — Telephone Encounter (Signed)
Spoke with patient and she just wants to be sure Dr. Henrene Pastor has reviewed her previous records prior to having an EGD since she has had problems in the past. She is scheduled for OV on 4/

## 2015-01-18 NOTE — Telephone Encounter (Signed)
Dr Henrene Pastor, Lillie Columbia has ordered the chart stat for you.

## 2015-01-18 NOTE — Telephone Encounter (Signed)
Rollene Fare, all much sure what she is referring to. I have not seen this patient in well over a decade. Pull up her old paper chart stat, put it on my desk, and I would be happy to review the records to see if there are any concerns or issues. Thank you

## 2015-01-18 NOTE — Telephone Encounter (Signed)
Spoke with patient again. She wants to have her procedure on 01/24/15 now. She states she has had problems with sedation in the past and when she saw Dr. Henrene Pastor in the past, he told her he did not want to do procedures on her because of the the reaction. She wants to be sure Dr. Henrene Pastor has reviewed her records and agrees with her having an EGD on 01/24/15 at 10:00 AM. Please, advise

## 2015-01-23 ENCOUNTER — Telehealth: Payer: Self-pay | Admitting: Internal Medicine

## 2015-01-23 ENCOUNTER — Ambulatory Visit (INDEPENDENT_AMBULATORY_CARE_PROVIDER_SITE_OTHER): Payer: BC Managed Care – PPO | Admitting: Family Medicine

## 2015-01-23 ENCOUNTER — Encounter: Payer: Self-pay | Admitting: Family Medicine

## 2015-01-23 VITALS — BP 154/91 | HR 58 | Temp 98.8°F | Ht 60.75 in | Wt 149.0 lb

## 2015-01-23 DIAGNOSIS — K219 Gastro-esophageal reflux disease without esophagitis: Secondary | ICD-10-CM | POA: Diagnosis not present

## 2015-01-23 NOTE — Progress Notes (Signed)
Pre visit review using our clinic review tool, if applicable. No additional management support is needed unless otherwise documented below in the visit note. 

## 2015-01-23 NOTE — Telephone Encounter (Signed)
Patient came to office to "meet" with Dr. Henrene Pastor as she had been unable to "talk" with him when she called in last week. She went to Dr. Barbie Banner office and discussed her concerns about her procedure there since she has not met with Dr. Henrene Pastor.
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
## 2015-01-23 NOTE — Telephone Encounter (Signed)
Patient apparently came in to office after meeting with Dr. Sarajane Jews about many concerns about EGD scheduled for tomorrow. She went to Dr. Sarajane Jews as she had "not heard anything back" from Dr. Henrene Pastor and she is very concerned with having her procedure tomorrow. Phone call routed to Admitting as patient states she and her family have had the stomach bug and she is just not sure she will be able to have procedure tomorrow with the way other family members have had recurrences, she is afraid she may as well, though she is fine now. She then proceeded very anxiously and frantically to discuss with me many concerns about previous procedures and that Dr. Sarajane Jews had advised her to cancel the procedure until she could have a face to face conversation with Dr. Henrene Pastor. It is noted that this patient has called in multiple times with cancelling and rescheduling her procedure. Previous records are on Dr. Blanch Media desk. Will discuss with Dr. Henrene Pastor for next steps as patient states she wants to meet with Dr. Henrene Pastor, doesn't want me to cancel her procedure as she states "she is so sick" and if she gets sicker she will just have to go to ER. Appointment was made with Dr. Henrene Pastor for May but patient states she doesn't believe she can wait that long. It is very difficult to ascertain what this patient is requesting.

## 2015-01-23 NOTE — Telephone Encounter (Signed)
Shelley Jones I have reviewed this patient's old chart. She is a complicated personality. I have not seen her in a very long time. In any event, I feel that it is best that she see me in the office to discuss relevant GI symptoms and the need for procedural work as well as the risks, benefits, and alternatives of possible procedures if indicated. Thus, I would cancel tomorrow's endoscopy. Please notify the patient. Thank you. I will copy Dr. Sarajane Jews on this note.

## 2015-01-23 NOTE — Telephone Encounter (Signed)
Phone call to patient to let her know that EGD appointment for 01/24/15 would be cancelled and she can discuss concerns/issues with Dr. Henrene Pastor at her appointment scheduled 02/19/15.

## 2015-01-24 ENCOUNTER — Encounter: Payer: BC Managed Care – PPO | Admitting: Internal Medicine

## 2015-02-05 LAB — COLOGUARD: COLOGUARD: NEGATIVE

## 2015-02-15 ENCOUNTER — Ambulatory Visit: Payer: BC Managed Care – PPO | Admitting: Internal Medicine

## 2015-02-19 ENCOUNTER — Encounter: Payer: Self-pay | Admitting: Internal Medicine

## 2015-02-19 ENCOUNTER — Ambulatory Visit (INDEPENDENT_AMBULATORY_CARE_PROVIDER_SITE_OTHER): Payer: BC Managed Care – PPO | Admitting: Internal Medicine

## 2015-02-19 VITALS — BP 142/80 | HR 64 | Ht 60.25 in | Wt 149.2 lb

## 2015-02-19 DIAGNOSIS — R140 Abdominal distension (gaseous): Secondary | ICD-10-CM

## 2015-02-19 DIAGNOSIS — Z1211 Encounter for screening for malignant neoplasm of colon: Secondary | ICD-10-CM

## 2015-02-19 DIAGNOSIS — K219 Gastro-esophageal reflux disease without esophagitis: Secondary | ICD-10-CM | POA: Diagnosis not present

## 2015-02-19 DIAGNOSIS — R1013 Epigastric pain: Secondary | ICD-10-CM | POA: Diagnosis not present

## 2015-02-19 NOTE — Patient Instructions (Signed)
Please follow up as needed 
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
## 2015-02-26 ENCOUNTER — Ambulatory Visit (INDEPENDENT_AMBULATORY_CARE_PROVIDER_SITE_OTHER): Payer: BC Managed Care – PPO | Admitting: Family Medicine

## 2015-02-26 ENCOUNTER — Encounter: Payer: Self-pay | Admitting: Family Medicine

## 2015-02-26 VITALS — BP 138/88 | HR 65 | Temp 99.0°F | Ht 60.25 in | Wt 148.0 lb

## 2015-02-26 DIAGNOSIS — R1013 Epigastric pain: Secondary | ICD-10-CM

## 2015-02-26 MED ORDER — METOCLOPRAMIDE HCL 10 MG PO TABS: 10.0000 mg | ORAL_TABLET | Freq: Four times a day (QID) | ORAL | Status: DC

## 2015-02-26 NOTE — Progress Notes (Signed)
Pre visit review using our clinic review tool, if applicable. No additional management support is needed unless otherwise documented below in the visit note. 

## 2015-02-26 NOTE — Progress Notes (Signed)
   Subjective:    Patient ID: Shelley Jones, female    DOB: 06-Feb-1951, 64 y.o.   MRN: [id number]  HPI Here to discuss her 8 week hx of epigastric pains, nausea, and belching. These are almost continuous now, and she feels miserable. Her appetite is poor but she tries to force herself to eat something. No fever. Her BMs are normal. Her chest burning from reflux has been well controlled. She saw Dr. Henrene Pastor last week and he felt that these symptoms were functional and that her anxiety was playing a large role. To recap, she has had a cholecystectomy and her recent abdominal US was unremarkable.    Review of Systems  Constitutional: Negative.   Respiratory: Negative.   Cardiovascular: Negative.   Gastrointestinal: Positive for nausea, abdominal pain and abdominal distention. Negative for vomiting, diarrhea, constipation, blood in stool, anal bleeding and rectal pain.  Genitourinary: Negative.        Objective:   Physical Exam  Constitutional: She appears well-developed and well-nourished.  In pain, she stops talking once in a while to hold her hand to her epigastrium and wince  Cardiovascular: Normal rate, regular rhythm, normal heart sounds and intact distal pulses.   Pulmonary/Chest: Effort normal and breath sounds normal.  Abdominal: Soft. Bowel sounds are normal. She exhibits no distension and no mass. There is no rebound and no guarding.  Epigastrium is tender           Assessment & Plan:  Her epigastric pain is very suggestive of a CBD blockage so we will set up a contrasted CT scan soon. In the meantime she will try Reglan 10 mg qid.

## 2015-03-05 ENCOUNTER — Encounter: Payer: Self-pay | Admitting: Family Medicine

## 2015-03-05 ENCOUNTER — Ambulatory Visit (INDEPENDENT_AMBULATORY_CARE_PROVIDER_SITE_OTHER): Payer: BC Managed Care – PPO | Admitting: Family Medicine

## 2015-03-05 VITALS — BP 132/84 | HR 61 | Temp 98.3°F | Ht 60.25 in | Wt 149.0 lb

## 2015-03-05 DIAGNOSIS — G8929 Other chronic pain: Secondary | ICD-10-CM | POA: Diagnosis not present

## 2015-03-05 DIAGNOSIS — R1013 Epigastric pain: Secondary | ICD-10-CM

## 2015-03-05 NOTE — Progress Notes (Signed)
Pre visit review using our clinic review tool, if applicable. No additional management support is needed unless otherwise documented below in the visit note. 

## 2015-03-05 NOTE — Progress Notes (Signed)
   Subjective:    Patient ID: Shelley Jones, female    DOB: 12-18-1950, 64 y.o.   MRN: [id number]  HPI Here to follow up on chronic upper abdominal pain. We recently saw her for this and we ordered an abdominal CT scan. However we have learned that her insurance company has denied coverage for this test. In the meantime over the weekend she felt a sudden "pop" in the RUQ and felt "something move" in the abdomen. Immediately she felt better and had less pressure in the upper abdomen. Since then she has felt better and has more of an appetite. No other changes. She has stopped the PPI and is using only Ranitidine OTC. She has not filled th Reglan I gave her last week.    Review of Systems  Constitutional: Negative.   Respiratory: Negative.   Cardiovascular: Negative.   Gastrointestinal: Positive for abdominal pain and abdominal distention. Negative for nausea, vomiting, diarrhea, constipation, blood in stool, anal bleeding and rectal pain.       Objective:   Physical Exam  Constitutional: She appears well-developed and well-nourished.  Cardiovascular: Normal rate, regular rhythm, normal heart sounds and intact distal pulses.   Pulmonary/Chest: Effort normal and breath sounds normal.  Abdominal: Soft. Bowel sounds are normal. She exhibits no distension and no mass. There is tenderness. There is no rebound and no guarding.  Mildly tender in the epigastrium           Assessment & Plan:  I ti is not clear what the event was that she is describing over the weekend but one could guess she may have passed a CBD stone. In any event she feels better. I agree with not starting th Reglan at this point. She will return prn.

## 2015-03-14 ENCOUNTER — Encounter: Payer: Self-pay | Admitting: Family Medicine

## 2015-03-14 ENCOUNTER — Ambulatory Visit (INDEPENDENT_AMBULATORY_CARE_PROVIDER_SITE_OTHER): Payer: BC Managed Care – PPO | Admitting: Family Medicine

## 2015-03-14 ENCOUNTER — Telehealth: Payer: Self-pay | Admitting: Family Medicine

## 2015-03-14 VITALS — BP 136/75 | HR 74 | Temp 98.1°F | Ht 60.25 in | Wt 148.0 lb

## 2015-03-14 DIAGNOSIS — R1013 Epigastric pain: Secondary | ICD-10-CM | POA: Diagnosis not present

## 2015-03-14 NOTE — Progress Notes (Signed)
   Subjective:    Patient ID: Shelley Jones, female    DOB: 1951/09/05, 64 y.o.   MRN: [id number]  HPI Here to follow up on her chronic epigastric pain and belching. She has not tried the Reglan yet. We ordered a CT scan at her last visit but her insurance company denied this. She says her BP is going up and down.    Review of Systems  Constitutional: Negative.   Respiratory: Negative.   Cardiovascular: Negative.   Gastrointestinal: Positive for nausea, abdominal pain and abdominal distention. Negative for vomiting, diarrhea, constipation, blood in stool and anal bleeding.       Objective:   Physical Exam  Constitutional: She appears well-developed and well-nourished.  Cardiovascular: Normal rate, regular rhythm, normal heart sounds and intact distal pulses.   Pulmonary/Chest: Effort normal and breath sounds normal.  Abdominal: Soft. Bowel sounds are normal. She exhibits no distension and no mass. There is no tenderness. There is no rebound and no guarding.          Assessment & Plan:  We will reorder the CT scan but use the phrase "unexplained pain" as demanded by the insurance company. Her BP is god today and we will continue to monitor this. I again suggested she try the Reglan in the meantime.

## 2015-03-14 NOTE — Addendum Note (Signed)
Addended by: Miles Costain T on: 03/14/2015 03:39 PM   Modules accepted: Orders

## 2015-03-14 NOTE — Telephone Encounter (Signed)
Called and spoke to [place] and informed her that the information has been updated.

## 2015-03-14 NOTE — Progress Notes (Signed)
Pre visit review using our clinic review tool, if applicable. No additional management support is needed unless otherwise documented below in the visit note. 

## 2015-03-14 NOTE — Telephone Encounter (Signed)
Pt is allergic to the contrast dye per stacy @ [facility] need for you to give her a call regarding order [phone]

## 2015-03-15 ENCOUNTER — Telehealth: Payer: Self-pay | Admitting: Family Medicine

## 2015-03-15 ENCOUNTER — Telehealth: Payer: Self-pay | Admitting: Internal Medicine

## 2015-03-15 NOTE — Telephone Encounter (Signed)
Pt is allergic to the contrast dye need to be pre med

## 2015-03-15 NOTE — Telephone Encounter (Signed)
Misty called yesterday and spoke with Erline Levine, this should have been clarified.

## 2015-03-15 NOTE — Telephone Encounter (Signed)
A user error has taken place.

## 2015-03-15 NOTE — Telephone Encounter (Signed)
noted 

## 2015-03-16 ENCOUNTER — Telehealth: Payer: Self-pay | Admitting: Family Medicine

## 2015-03-16 MED ORDER — PREDNISONE 50 MG PO TABS: ORAL_TABLET | ORAL | Status: DC

## 2015-03-16 NOTE — Telephone Encounter (Signed)
Spoke to the pt.  She was notified to take of directions for medication.  Pt repeated directions back to me so I knew she understood the directions.  Pt to pick up Benadryl 25 mg tab OTC and take two (50 mg total). She also understand this.  Erline Levine notified that medication has been sent to the pharmacy.

## 2015-03-16 NOTE — Telephone Encounter (Signed)
Spoke to [place] at [facility].  Pt has decided that she wants to do the CT with Contrast.  Erline Levine is requesting the pt have prednisone 50 mg at thirteen hours before the study and at 7 hours before the study and at 1 hour prior to study for her allergy.  She will also need Benadryl 50 mg one hour prior to study.  Can reach [place] [phone]

## 2015-03-16 NOTE — Telephone Encounter (Signed)
Please arrange for her to get these meds per protocol

## 2015-03-20 ENCOUNTER — Inpatient Hospital Stay: Admission: RE | Admit: 2015-03-20 | Payer: BC Managed Care – PPO | Source: Ambulatory Visit

## 2015-03-20 NOTE — Telephone Encounter (Signed)
Magda Paganini have you seen these results?

## 2015-03-22 ENCOUNTER — Encounter: Payer: Self-pay | Admitting: Gastroenterology

## 2015-03-22 ENCOUNTER — Inpatient Hospital Stay: Admission: RE | Admit: 2015-03-22 | Payer: BC Managed Care – PPO | Source: Ambulatory Visit

## 2015-03-23 ENCOUNTER — Emergency Department (HOSPITAL_COMMUNITY): Payer: BC Managed Care – PPO

## 2015-03-23 ENCOUNTER — Other Ambulatory Visit: Payer: Self-pay

## 2015-03-23 ENCOUNTER — Emergency Department (HOSPITAL_COMMUNITY)
Admission: EM | Admit: 2015-03-23 | Discharge: 2015-03-23 | Disposition: A | Discharge status: Home / Self Care | Payer: BC Managed Care – PPO | Attending: Emergency Medicine | Admitting: Emergency Medicine

## 2015-03-23 ENCOUNTER — Encounter (HOSPITAL_COMMUNITY): Payer: Self-pay | Admitting: *Deleted

## 2015-03-23 DIAGNOSIS — F419 Anxiety disorder, unspecified: Secondary | ICD-10-CM | POA: Diagnosis not present

## 2015-03-23 DIAGNOSIS — E663 Overweight: Secondary | ICD-10-CM | POA: Diagnosis not present

## 2015-03-23 DIAGNOSIS — E785 Hyperlipidemia, unspecified: Secondary | ICD-10-CM | POA: Insufficient documentation

## 2015-03-23 DIAGNOSIS — Z79899 Other long term (current) drug therapy: Secondary | ICD-10-CM | POA: Insufficient documentation

## 2015-03-23 DIAGNOSIS — R1013 Epigastric pain: Secondary | ICD-10-CM | POA: Insufficient documentation

## 2015-03-23 DIAGNOSIS — Z86018 Personal history of other benign neoplasm: Secondary | ICD-10-CM | POA: Insufficient documentation

## 2015-03-23 DIAGNOSIS — Z8719 Personal history of other diseases of the digestive system: Secondary | ICD-10-CM | POA: Insufficient documentation

## 2015-03-23 DIAGNOSIS — Z8619 Personal history of other infectious and parasitic diseases: Secondary | ICD-10-CM | POA: Diagnosis not present

## 2015-03-23 DIAGNOSIS — Z86718 Personal history of other venous thrombosis and embolism: Secondary | ICD-10-CM | POA: Insufficient documentation

## 2015-03-23 DIAGNOSIS — Z9049 Acquired absence of other specified parts of digestive tract: Secondary | ICD-10-CM | POA: Diagnosis not present

## 2015-03-23 DIAGNOSIS — R079 Chest pain, unspecified: Secondary | ICD-10-CM | POA: Insufficient documentation

## 2015-03-23 DIAGNOSIS — G8929 Other chronic pain: Secondary | ICD-10-CM | POA: Diagnosis not present

## 2015-03-23 DIAGNOSIS — Z8739 Personal history of other diseases of the musculoskeletal system and connective tissue: Secondary | ICD-10-CM | POA: Insufficient documentation

## 2015-03-23 DIAGNOSIS — Z87448 Personal history of other diseases of urinary system: Secondary | ICD-10-CM | POA: Diagnosis not present

## 2015-03-23 DIAGNOSIS — J45909 Unspecified asthma, uncomplicated: Secondary | ICD-10-CM | POA: Diagnosis not present

## 2015-03-23 LAB — CBC WITH DIFFERENTIAL/PLATELET
BASOS ABS: 0 10*3/uL (ref 0.0–0.1)
BASOS PCT: 0 % (ref 0–1)
Eosinophils Absolute: 0.3 10*3/uL (ref 0.0–0.7)
Eosinophils Relative: 2 % (ref 0–5)
HEMATOCRIT: 44.5 % (ref 36.0–46.0)
Hemoglobin: 14.5 g/dL (ref 12.0–15.0)
Lymphocytes Relative: 25 % (ref 12–46)
Lymphs Abs: 3.8 10*3/uL (ref 0.7–4.0)
MCH: 30 pg (ref 26.0–34.0)
MCHC: 32.6 g/dL (ref 30.0–36.0)
MCV: 91.9 fL (ref 78.0–100.0)
Monocytes Absolute: 1.4 10*3/uL — ABNORMAL HIGH (ref 0.1–1.0)
Monocytes Relative: 9 % (ref 3–12)
NEUTROS PCT: 64 % (ref 43–77)
Neutro Abs: 9.7 10*3/uL — ABNORMAL HIGH (ref 1.7–7.7)
PLATELETS: 302 10*3/uL (ref 150–400)
RBC: 4.84 MIL/uL (ref 3.87–5.11)
RDW: 13.5 % (ref 11.5–15.5)
WBC: 15.2 10*3/uL — ABNORMAL HIGH (ref 4.0–10.5)

## 2015-03-23 LAB — COMPREHENSIVE METABOLIC PANEL
ALK PHOS: 91 U/L (ref 38–126)
ALT: 28 U/L (ref 14–54)
AST: 21 U/L (ref 15–41)
Albumin: 3.9 g/dL (ref 3.5–5.0)
Anion gap: 9 (ref 5–15)
BUN: 20 mg/dL (ref 6–20)
CO2: 26 mmol/L (ref 22–32)
Calcium: 9.6 mg/dL (ref 8.9–10.3)
Chloride: 104 mmol/L (ref 101–111)
Creatinine, Ser: 0.97 mg/dL (ref 0.44–1.00)
GFR calc Af Amer: >60 mL/min (ref >60)
Glucose, Bld: 110 mg/dL — ABNORMAL HIGH (ref 65–99)
Potassium: 4.5 mmol/L (ref 3.5–5.1)
Sodium: 139 mmol/L (ref 135–145)
Total Bilirubin: 0.4 mg/dL (ref 0.3–1.2)
Total Protein: 7.4 g/dL (ref 6.5–8.1)

## 2015-03-23 LAB — TROPONIN I: Troponin I: <0.03 ng/mL (ref <0.031)

## 2015-03-23 LAB — LIPASE, BLOOD: Lipase: 35 U/L (ref 22–51)

## 2015-03-23 NOTE — ED Notes (Signed)
Patient comes from home with epigastric pain since March 19 when she felt "something pop" followed by a stabbing pain under her xiphoid process.  Patient has continued to have pain there with frequent burping and pressure.  Patient had several episodes of N/V around that time as well.  Patient has seen PCP 3 times, was seen at [facility] and has seen Dr. Henrene Pastor at [facility] regarding this pain.  An ultrasound was done in April with no significant findings.  Dr. Henrene Pastor recommended patient take a PPI and Mylanta, which patient took for 34 days with some relief of burning pain, but not pressure.  Patient also took Zantac 150 mg for several weeks with some relief of burning pain, but not pressure/burping.  Patient states bending forward or lying flat beyond 30 degrees and laying on her side all make pain worse.  Patient does not associate symptoms with eating or drinking.  Patient states the medications have helped with the burning pain, but nothing relieves the pressure sensation she feels.  Patient states this morning @ 2 am the pain became more intense and constant.  Patient denies N/V/D currently or in recent days.

## 2015-03-23 NOTE — ED Notes (Addendum)
Pt reports continued mid to RUQ burning/pressure worsening 0200 today. Pt has been evaluated for same; see triage note. Upon assessment, pt belching several times. Pickering MD at bedside.

## 2015-03-23 NOTE — Discharge Instructions (Signed)
Follow-up for the CAT scan in 4 days as planned. Start taking the Reglan to see if it helps.

## 2015-03-23 NOTE — ED Provider Notes (Signed)
CSN: [id number]     Arrival date & time 03/23/15  0707 History   First MD Initiated Contact with Patient 03/23/15 0719     Chief Complaint  Patient presents with  . Abdominal Pain     (Consider location/radiation/quality/duration/timing/severity/associated sxs/prior Treatment) Patient is a 64 y.o. female presenting with abdominal pain. The history is provided by the patient.  Abdominal Pain Associated symptoms: chest pain   Associated symptoms: no diarrhea, no nausea, no shortness of breath and no vomiting    patient presents with abdominal pain. Began acutely 6 weeks ago. Initially started with a stabbing pain but has involved a lot of burping along the time. Since the epigastric area. She has seen primary care, had a negative ultrasound has seen GI for it. GI believed it was functional abdominal pain. Patient's PCP apparently thinks a common duct stone passed. She's had a previous cholecystectomy. Patient states there is a dull pain. Eating does not change it. She's also had some pain that goes up to her chest. She is scheduled for CT scan of 4 days, however she needs a sterile prep for it. Had be PPI with some improvement of her chest pain also was told that her blood pressures been high. She states she's been to Dr. Lajoyce Corners times her blood pressure has been elevated since that time. She states it was elevated since a few weeks before the beginning of the abdominal pain that started after she got epinephrine with a dental injection. No change in the stools. No nausea vomiting or diarrhea..  Past Medical History  Diagnosis Date  . IBS (irritable bowel syndrome)   . Allergic rhinitis   . Asthma   . DVT (deep venous thrombosis)   . Hyperlipidemia   . Uterine fibroids affecting pregnancy   . Gynecological examination     sees Dr. Corinna Capra  . Fibromyalgia   . Diverticulitis     sees Dr. Deatra Ina  . Anxiety   . Restless leg   . Interstitial cystitis     sees Dr. Ellard Artis   . Shingles    Past Surgical  History  Procedure Laterality Date  . Tonsillectomy    . Cholecystectomy    . Colonoscopy      all attempts at a colonoscopy (last in 1995) were unsuccessful due to a very tortuous colon   Family History  Problem Relation Age of Onset  . Glaucoma Mother   . Hypertension Other   . Cancer Other     bone, colon  . Diabetes Other    History  Substance Use Topics  . Smoking status: Never Smoker   . Smokeless tobacco: Never Used  . Alcohol Use: No   OB History    No data available     Review of Systems  Constitutional: Negative for activity change and appetite change.  Eyes: Negative for pain.  Respiratory: Negative for chest tightness and shortness of breath.   Cardiovascular: Positive for chest pain. Negative for leg swelling.  Gastrointestinal: Positive for abdominal pain. Negative for nausea, vomiting and diarrhea.  Genitourinary: Negative for flank pain.  Musculoskeletal: Negative for back pain and neck stiffness.  Skin: Negative for rash.  Neurological: Negative for weakness, numbness and headaches.  Psychiatric/Behavioral: Negative for behavioral problems.      Allergies  Cefdinir; Iodine; Iohexol; Lipitor; Meperidine hcl; Midazolam; Nalbuphine; Novocain; Ofloxacin; and Clarithromycin  Home Medications   Prior to Admission medications   Medication Sig Start Date End Date Taking? Authorizing Provider  albuterol (VENTOLIN  HFA) 108 (90 BASE) MCG/ACT inhaler Inhale 2 puffs into the lungs every 4 (four) hours as needed for wheezing. 12/29/14  Yes Laurey Morale, MD  calcium carbonate (TUMS EX) 750 MG chewable tablet Chew 2 tablets by mouth daily.    Yes Historical Provider, MD  Calcium Carbonate-Vitamin D (CALCIUM 600+D) 600-400 MG-UNIT per tablet Take 1 tablet by mouth 2 (two) times daily.     Yes Historical Provider, MD  hydrOXYzine (ATARAX/VISTARIL) 10 MG tablet Take 1 tablet (10 mg total) by mouth at bedtime. Patient taking differently: Take 5 mg by mouth at bedtime.   12/29/14  Yes Laurey Morale, MD  MELATONIN PO Take 2 capsules by mouth at bedtime.   Yes Historical Provider, MD  psyllium (METAMUCIL) 58.6 % packet Take 1 packet by mouth daily.   Yes Historical Provider, MD  ranitidine (ZANTAC) 75 MG tablet Take 75 mg by mouth 2 (two) times daily.   Yes Historical Provider, MD  amoxicillin-clavulanate (AUGMENTIN) 875-125 MG per tablet Take 1 tablet by mouth every 12 (twelve) hours.    Historical Provider, MD  metoCLOPramide (REGLAN) 10 MG tablet Take 1 tablet (10 mg total) by mouth 4 (four) times daily. Patient not taking: Reported on 03/14/2015 02/26/15   Laurey Morale, MD  predniSONE (DELTASONE) 50 MG tablet Take 13 hours prior to procedure, 7 hours prior and 1 hour prior. Patient not taking: Reported on 03/23/2015 03/16/15   Laurey Morale, MD  rosuvastatin (CRESTOR) 10 MG tablet Take 1 tablet (10 mg total) by mouth daily. Patient not taking: Reported on 02/26/2015 12/29/14   Laurey Morale, MD  valACYclovir (VALTREX) 500 MG tablet Take 1 tablet (500 mg total) by mouth 2 (two) times daily as needed (for fever blisters). Patient not taking: Reported on 03/14/2015 12/29/14   Laurey Morale, MD   BP 147/73 mmHg  Pulse 63  Temp(Src) 98.9 F (37.2 C) (Oral)  Resp 18  Ht 5' (1.524 m)  Wt 147 lb (66.679 kg)  BMI 28.71 kg/m2  SpO2 98% Physical Exam  Constitutional: She is oriented to person, place, and time. She appears well-developed and well-nourished.  Patient is somewhat overweight  HENT:  Head: Normocephalic and atraumatic.  Eyes: EOM are normal. Pupils are equal, round, and reactive to light.  Cardiovascular: Normal rate, regular rhythm and normal heart sounds.   No murmur heard. Pulmonary/Chest: Effort normal and breath sounds normal. No respiratory distress. She has no wheezes. She has no rales.  Abdominal: Soft. She exhibits no distension. There is no tenderness. There is no rebound and no guarding.  Musculoskeletal: Normal range of motion.  Neurological:  She is alert and oriented to person, place, and time.  Skin: Skin is warm and dry.  Psychiatric: She has a normal mood and affect. Her speech is normal.  Nursing note and vitals reviewed.   ED Course  Procedures (including critical care time) Labs Review Labs Reviewed  COMPREHENSIVE METABOLIC PANEL - Abnormal; Notable for the following:    Glucose, Bld 110 (*)    All other components within normal limits  CBC WITH DIFFERENTIAL/PLATELET - Abnormal; Notable for the following:    WBC 15.2 (*)    Neutro Abs 9.7 (*)    Monocytes Absolute 1.4 (*)    All other components within normal limits  TROPONIN I  LIPASE, BLOOD    Imaging Review Dg Chest 2 View  03/23/2015   CLINICAL DATA:  Epigastric region pain, acute  EXAM: CHEST  2 VIEW  COMPARISON:  May 08, 2005  FINDINGS: There is no edema or consolidation. There is minimal left base atelectasis. The heart size and pulmonary vascularity are normal. No adenopathy. No bone lesions.  IMPRESSION: No edema or consolidation.  Minimal left base atelectasis.   Electronically Signed   By: Lowella Grip III M.D.   On: 03/23/2015 08:42     EKG Interpretation   Date/Time:  Friday March 23 2015 08:09:30 EDT Ventricular Rate:  65 PR Interval:  150 QRS Duration: 83 QT Interval:  437 QTC Calculation: 454 R Axis:   -9 Text Interpretation:  Sinus rhythm Probable left atrial enlargement  Abnormal R-wave progression, early transition No significant change since  last tracing Confirmed by Marieclaire Bettenhausen  MD, Perl Kerney [phone]) on 03/23/2015  11:03:54 AM      MDM   Final diagnoses:  Abdominal pain, chronic, epigastric    Patient with abdominal pain over the last 6 months. Has had workup as an outpatient. Has outpatient CT scan ordered. Blood work reassuring. Also had hypertension and chest pain and EKG and enzymes reassuring. White count is now mildly elevated. Appears to be stable for discharge. Common duct stone felt less likely with normal LFTs and  normal ultrasound.    Davonna Belling, MD 03/23/15 1104

## 2015-03-27 ENCOUNTER — Telehealth: Payer: Self-pay | Admitting: Family Medicine

## 2015-03-27 ENCOUNTER — Inpatient Hospital Stay: Admission: RE | Admit: 2015-03-27 | Payer: BC Managed Care – PPO | Source: Ambulatory Visit

## 2015-03-27 NOTE — Telephone Encounter (Signed)
Re-attempted to contact patient for advice. Received voicemail.

## 2015-03-27 NOTE — Telephone Encounter (Signed)
There is nothing more I can do. This is a GI problem so she needs to follow up with GI.

## 2015-03-27 NOTE — Telephone Encounter (Signed)
Pt called upset that we are not able to obtain a prior auth for her CT and feels like she needs further direction. Stated she "has an old Rx of Alprazolam and told me that she was going to see what happens if she just takes the whole bottle" and then hung up on me.

## 2015-03-27 NOTE — Telephone Encounter (Signed)
Spoke to MD Sarajane Jews. He states she needs to follow-up with GI. Attempted to call patient. Received automated voicemail. Will re-attempt shortly.

## 2015-03-28 NOTE — Telephone Encounter (Signed)
Spoke with pt and her husband and advised that per Dr. Sarajane Jews every lab and procedure order that he feels is necessary has been ordered.  The test that would be most beneficial the insurance will not cover. Pt and her husband state that understand that but they spoke with the insurance company and was told that there was a coding issue.  Pt has spoken with Dr. Henrene Pastor and was advised to call our office.  Dr. Henrene Pastor and the ER physician pt seen both agree that this procedure is needed. The peer review is needed beacuase the pelvic part is included on the procedure request.  Pt states she needs upper and lower because she has had issues with both.  Information for peer to peer is included below.    The peer to peer review can be done at [phone] and the reference number is [id number].  The company is [employer] for [employer] can you help with this?

## 2015-03-28 NOTE — Telephone Encounter (Signed)
Spoke with pt and her husband and advised that per Dr. Sarajane Jews eve

## 2015-03-28 NOTE — Telephone Encounter (Signed)
Spoke with MD Sarajane Jews about patient. Plans to speak with GI to discuss plans for diagnostics.

## 2015-03-28 NOTE — Telephone Encounter (Signed)
Spoke with Shelley Jones and let her know that her Cologuard results were negative and per Dr. Henrene Pastor, she should repeat it in 3 years.  Shelley Jones and her husband expressed some concerns they are having getting a CT ordered by Dr. Sarajane Jews approved.  I encouraged them to call if they needed anything else.  Shelley Jones and her husband agreed.

## 2015-03-29 ENCOUNTER — Telehealth: Payer: Self-pay

## 2015-03-29 ENCOUNTER — Other Ambulatory Visit: Payer: Self-pay

## 2015-03-29 MED ORDER — PREDNISONE 50 MG PO TABS: ORAL_TABLET | ORAL | Status: DC

## 2015-03-29 NOTE — Telephone Encounter (Signed)
CT has been scheduled for 04/02/15

## 2015-03-29 NOTE — Telephone Encounter (Signed)
Left mess for pt to return call (concerning overdue mammogram)

## 2015-03-29 NOTE — Telephone Encounter (Signed)
I spoke to Dr. Henrene Pastor and he wil be glad to arrange for her to have the CT scan we wanted, but he needs to see her in an OV first. Please tell her to make an OV with Dr. Henrene Pastor ASAP

## 2015-04-02 ENCOUNTER — Telehealth: Payer: Self-pay | Admitting: Family Medicine

## 2015-04-02 ENCOUNTER — Ambulatory Visit (INDEPENDENT_AMBULATORY_CARE_PROVIDER_SITE_OTHER)
Admission: RE | Admit: 2015-04-02 | Discharge: 2015-04-02 | Disposition: A | Discharge status: Home / Self Care | Payer: BC Managed Care – PPO | Source: Ambulatory Visit | Attending: Family Medicine | Admitting: Family Medicine

## 2015-04-02 DIAGNOSIS — R1013 Epigastric pain: Secondary | ICD-10-CM | POA: Diagnosis not present

## 2015-04-02 NOTE — Telephone Encounter (Signed)
Stacey from [facility] called and I relayed this information to Dr. Sarajane Jews. The order is still there for CT without contrast and Dr. Sarajane Jews did say this would be okay. I spoke back with CT and gave this information. They will call pt to schedule test without contrast. I added Prednisone to pt's drug allergy list.

## 2015-04-02 NOTE — Telephone Encounter (Signed)
Pt has decided to go every 3 yrs for her mammogrm.

## 2015-04-02 NOTE — Telephone Encounter (Signed)
Pt states she took the prednisone last night to get ready for the CT w/ two types of contrast. Pt instructed to take 3 tabs of 50 mg prednisone. Pt states she got very sick, nauseous, gagging, heart racing. Pt called [facility] and spoke w/ someone that told her she could take a tums to help. Pt spoke w/ [facility] this am and told them what happened. Pt was going to do only the barium CT , but they told her not to come b/c it would not match the order in the system.  Pt would like to know what dr fry would have her do

## 2015-04-04 NOTE — Telephone Encounter (Signed)
Followed up on patient's chart and patient had CT scan performed on 04/02/15

## 2015-04-06 ENCOUNTER — Telehealth: Payer: Self-pay | Admitting: Family Medicine

## 2015-04-06 NOTE — Telephone Encounter (Signed)
I spoke with pt and gave results.  

## 2015-04-06 NOTE — Telephone Encounter (Signed)
Pt would like a call back about CT results

## 2015-04-24 ENCOUNTER — Ambulatory Visit (INDEPENDENT_AMBULATORY_CARE_PROVIDER_SITE_OTHER): Payer: BC Managed Care – PPO | Admitting: Family Medicine

## 2015-04-24 ENCOUNTER — Encounter: Payer: Self-pay | Admitting: Family Medicine

## 2015-04-24 VITALS — BP 142/85 | HR 65 | Temp 99.2°F | Ht 60.25 in | Wt 146.0 lb

## 2015-04-24 DIAGNOSIS — F411 Generalized anxiety disorder: Secondary | ICD-10-CM | POA: Diagnosis not present

## 2015-04-24 DIAGNOSIS — R1032 Left lower quadrant pain: Secondary | ICD-10-CM

## 2015-04-24 DIAGNOSIS — K6289 Other specified diseases of anus and rectum: Secondary | ICD-10-CM

## 2015-04-24 DIAGNOSIS — K589 Irritable bowel syndrome without diarrhea: Secondary | ICD-10-CM

## 2015-04-24 LAB — POCT URINALYSIS DIPSTICK
BILIRUBIN UA: NEGATIVE
Blood, UA: NEGATIVE
GLUCOSE UA: NEGATIVE
Ketones, UA: NEGATIVE
NITRITE UA: NEGATIVE
Protein, UA: NEGATIVE
Spec Grav, UA: 1.01
Urobilinogen, UA: 0.2
pH, UA: 5.5

## 2015-04-24 MED ORDER — MESALAMINE 1000 MG RE SUPP: 1000.0000 mg | Freq: Two times a day (BID) | RECTAL | Status: DC

## 2015-04-24 MED ORDER — ALPRAZOLAM 0.25 MG PO TABS: 0.2500 mg | ORAL_TABLET | Freq: Three times a day (TID) | ORAL | Status: DC | PRN

## 2015-04-24 MED ORDER — DICYCLOMINE HCL 20 MG PO TABS: 20.0000 mg | ORAL_TABLET | Freq: Four times a day (QID) | ORAL | Status: DC

## 2015-04-24 NOTE — Progress Notes (Signed)
Pre visit review using our clinic review tool, if applicable. No additional management support is needed unless otherwise documented below in the visit note. 

## 2015-04-24 NOTE — Progress Notes (Signed)
   Subjective:    Patient ID: Shelley Jones, female    DOB: Oct 25, 1950, 64 y.o.   MRN: [id number]  HPI Here for 6 days of lower abdominal cramps and diarrhea. She also has burning pains around the rectum itself. No blood in the stools. Some nausea without vomiting. She has had low grade fevers. This feels different to her than her usual diverticulitis pain which usually involves the left flank and LLQ. Using Bentyl for some relief. She had a CT scan of the abdomen on 04-02-15 that was unremarkable. She also asks for a refill on Xanax, which she used a few years ago for anxiety. Her anxiety seems to make her abdominal sx worse.    Review of Systems  Constitutional: Positive for fever. Negative for chills and diaphoresis.  Respiratory: Negative.   Cardiovascular: Negative.   Gastrointestinal: Positive for nausea, abdominal pain, diarrhea and rectal pain. Negative for vomiting, constipation, blood in stool, abdominal distention and anal bleeding.  Genitourinary: Negative.        Objective:   Physical Exam  Constitutional: She appears well-developed and well-nourished. No distress.  Cardiovascular: Normal rate, regular rhythm, normal heart sounds and intact distal pulses.   Pulmonary/Chest: Effort normal and breath sounds normal.  Abdominal: Soft. Bowel sounds are normal. She exhibits no distension and no mass. There is no rebound and no guarding.  Moderately tender in the entire lower abdomen           Assessment & Plan:  Treat the proctitis with Canasa suppositories. Refilled Bentyl to use for cramps and Xanax for anxiety. Culture the urine sample.

## 2015-04-27 LAB — URINE CULTURE: Colony Count: 25000

## 2015-05-01 MED ORDER — NITROFURANTOIN MONOHYD MACRO 100 MG PO CAPS: 100.0000 mg | ORAL_CAPSULE | Freq: Two times a day (BID) | ORAL | Status: DC

## 2015-05-01 NOTE — Addendum Note (Signed)
Addended by: Aggie Hacker A on: 05/01/2015 04:53 PM   Modules accepted: Orders

## 2015-05-15 ENCOUNTER — Ambulatory Visit (INDEPENDENT_AMBULATORY_CARE_PROVIDER_SITE_OTHER): Payer: BC Managed Care – PPO | Admitting: Internal Medicine

## 2015-05-15 ENCOUNTER — Encounter: Payer: Self-pay | Admitting: Internal Medicine

## 2015-05-15 VITALS — BP 110/60 | HR 72 | Ht 60.25 in | Wt 147.0 lb

## 2015-05-15 DIAGNOSIS — R140 Abdominal distension (gaseous): Secondary | ICD-10-CM | POA: Diagnosis not present

## 2015-05-15 DIAGNOSIS — R1013 Epigastric pain: Secondary | ICD-10-CM | POA: Diagnosis not present

## 2015-05-15 DIAGNOSIS — K219 Gastro-esophageal reflux disease without esophagitis: Secondary | ICD-10-CM

## 2015-05-15 NOTE — Patient Instructions (Signed)
Continue to take Nexium over the counter and follow up in one year
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
## 2015-05-28 ENCOUNTER — Telehealth: Payer: Self-pay | Admitting: Family Medicine

## 2015-05-28 NOTE — Telephone Encounter (Signed)
Patient wants to know if she can take a probiotic with some medications that she is taking. Patient also wants to know what she was give macrobid for on 05/01/2015. Please give patient call at [phone].

## 2015-05-29 NOTE — Telephone Encounter (Signed)
I left a voice message for pt to return my call.  

## 2015-05-30 NOTE — Telephone Encounter (Signed)
I spoke with pt  

## 2015-07-10 ENCOUNTER — Encounter: Payer: Self-pay | Admitting: Family Medicine

## 2015-07-10 ENCOUNTER — Ambulatory Visit (INDEPENDENT_AMBULATORY_CARE_PROVIDER_SITE_OTHER): Payer: BC Managed Care – PPO | Admitting: Family Medicine

## 2015-07-10 VITALS — BP 138/88 | HR 53 | Temp 99.0°F | Ht 60.25 in | Wt 149.0 lb

## 2015-07-10 DIAGNOSIS — G609 Hereditary and idiopathic neuropathy, unspecified: Secondary | ICD-10-CM

## 2015-07-10 DIAGNOSIS — N390 Urinary tract infection, site not specified: Secondary | ICD-10-CM

## 2015-07-10 DIAGNOSIS — R739 Hyperglycemia, unspecified: Secondary | ICD-10-CM | POA: Diagnosis not present

## 2015-07-10 DIAGNOSIS — E538 Deficiency of other specified B group vitamins: Secondary | ICD-10-CM | POA: Diagnosis not present

## 2015-07-10 LAB — POCT URINALYSIS DIPSTICK
Bilirubin, UA: NEGATIVE
Blood, UA: NEGATIVE
Glucose, UA: NEGATIVE
Ketones, UA: NEGATIVE
Nitrite, UA: NEGATIVE
PROTEIN UA: NEGATIVE
Spec Grav, UA: 1.02
UROBILINOGEN UA: 0.2
pH, UA: 7.5

## 2015-07-10 LAB — VITAMIN B12: VITAMIN B 12: 966 pg/mL — AB (ref 211–911)

## 2015-07-10 LAB — HEMOGLOBIN A1C: Hgb A1c MFr Bld: 5.6 % (ref 4.6–6.5)

## 2015-07-10 NOTE — Progress Notes (Signed)
   Subjective:    Patient ID: Shelley Jones, female    DOB: [birth year], 64 y.o.   MRN: [id number]  HPI Here for 2 things. First she asks that we check a urine sample because she has been treated for a UTI recently by Dr. Amalia Hailey, her urologist. She has had no symptoms at all, but she has taken 2 rounds of antibiotics. One of these was Doxycycline but she cannot remember the name of the other one. Also she has had intermittent numbness and tingling in both feet, the right being the worst one, for the past few months. She has a hx of B12 deficiency, so she started taking some OTC B12 this past week. She has already seen some improvement of the symptoms.    Review of Systems  Constitutional: Negative.   Respiratory: Negative.   Cardiovascular: Negative.   Gastrointestinal: Negative.   Genitourinary: Negative.   Neurological: Positive for numbness. Negative for weakness.       Objective:   Physical Exam  Constitutional: She is oriented to person, place, and time. She appears well-developed and well-nourished.  Cardiovascular: Normal rate, regular rhythm, normal heart sounds and intact distal pulses.   Pulmonary/Chest: Effort normal and breath sounds normal.  Musculoskeletal: She exhibits no edema.  Neurological: She is alert and oriented to person, place, and time. No cranial nerve deficit. She exhibits normal muscle tone. Coordination normal.  Skin:  The feet appear normal           Assessment & Plan:  She still has a UTI, so I advised her to ask Dr. Amalia Hailey what he would like for her to do at this point. She has neuropathy in the feet, and it sounds like it may be related to a B12 deficiency. We will check a B12 level and an A1c today.

## 2015-07-10 NOTE — Progress Notes (Signed)
Pre visit review using our clinic review tool, if applicable. No additional management support is needed unless otherwise documented below in the visit note. 

## 2015-07-10 NOTE — Addendum Note (Signed)
Addended by: Aggie Hacker A on: 07/10/2015 12:14 PM   Modules accepted: Orders

## 2015-08-02 ENCOUNTER — Telehealth: Payer: Self-pay

## 2015-08-02 ENCOUNTER — Other Ambulatory Visit: Payer: Self-pay

## 2015-08-02 NOTE — Telephone Encounter (Signed)
Spoke with patient's husband regarding outside records for his wife.  She has been seen several times in the last 6 months in the office and patient's husband was adamant that at one of her appointments he brought outside records from Dr. Fiserv office in [place].  He states that Dr. Henrene Pastor told him he reviewed them.  For some reason, they don't appear to have been scanned and they are not referenced in any of Dr. Blanch Media office notes.  Patient has spoken to me and Marcie Bal in medical records multiple times about these records.  His wife is having uterine surgery next week and has a very long allergy list and history of difficulty when she was sedated in the past.  Patient has been very agitated and worried that her surgeon needs these records in order to understand her complicated history.  I called Dr. Edd Fabian office twice and they cannot find any records or office notes - her chart is so old they no longer have it- nor do they have a record of Mr. Thomasene Mohair coming to pick anything up.  Patient walked into the office to check on this situation and I explained how carefully I had researched this but have no real answer as to what happened.  I explained our protocol of having Dr. Henrene Pastor review records and then return them to [place] to be stamped and sent down to be scanned.  I apologized profusely that I can't seem to find what he is looking for but that any information Dr. Henrene Pastor found pertinent he would have put in his office note and that the information is way more current and informative than anything that came out of an old file.  I offered to fax pertinent information, including Dr. Blanch Media last office note, to the surgeon that would make sure he was completely informed and up to date on patient's history and complications.  Mr. Mcconaughy indicated that the issue was more about trying to give his wife peace of mind and assuage some of her anxiety than the actual papers to which he is referring.  Mr. Ingerson appeared  reassured and satisfied.  I encouraged him to tell his wife to express her concerns to Dr. Corinna Capra before surgery so he would reassure her and help calm her anxiety.  Called Dr. Gregor Hams office and spoke with Abner Greenspan, his nurse and relayed the above information to her.  We reviewed patient's allergy list together and her list was, in fact, missing a few things.  She told me to fax an updated allergy list but that they had absolutely all the information Dr. Corinna Capra would need.  She will let me know if they need anything else.

## 2015-08-07 ENCOUNTER — Other Ambulatory Visit: Payer: Self-pay | Admitting: Obstetrics and Gynecology

## 2015-08-21 HISTORY — PX: UTERINE FIBROID SURGERY: SHX826

## 2015-08-31 ENCOUNTER — Ambulatory Visit (INDEPENDENT_AMBULATORY_CARE_PROVIDER_SITE_OTHER): Payer: BC Managed Care – PPO | Admitting: Family Medicine

## 2015-08-31 ENCOUNTER — Encounter: Payer: Self-pay | Admitting: Family Medicine

## 2015-08-31 ENCOUNTER — Telehealth: Payer: Self-pay | Admitting: Family Medicine

## 2015-08-31 VITALS — BP 126/81 | HR 75 | Temp 98.6°F

## 2015-08-31 DIAGNOSIS — A084 Viral intestinal infection, unspecified: Secondary | ICD-10-CM

## 2015-08-31 MED ORDER — DIPHENOXYLATE-ATROPINE 2.5-0.025 MG PO TABS: 2.0000 | ORAL_TABLET | Freq: Four times a day (QID) | ORAL | Status: DC | PRN

## 2015-08-31 MED ORDER — ONDANSETRON HCL 8 MG PO TABS
8.0000 mg | ORAL_TABLET | Freq: Three times a day (TID) | ORAL | Status: DC | PRN
Start: 2015-08-31 — End: 2015-12-31

## 2015-08-31 MED ORDER — PROMETHAZINE HCL 50 MG/ML IJ SOLN
50.0000 mg | Freq: Once | INTRAMUSCULAR | Status: AC
  Administered 2015-08-31: 50 mg via INTRAMUSCULAR

## 2015-08-31 NOTE — Telephone Encounter (Signed)
Pt said she has been vomiting and diarrhea since 21 am and is asking if something can be called in  She said there is no way she can in    [place]

## 2015-08-31 NOTE — Progress Notes (Signed)
   Subjective:    Patient ID: Shelley Jones, female    DOB: [birth year], 64 y.o.   MRN: [id number]  HPI Here for 24 hours of nausea, vomiting, and diarrhea. No fever. No abdominal pain. Imodium does not help.   Review of Systems  Constitutional: Negative.   HENT: Negative.   Eyes: Negative.   Respiratory: Negative.   Cardiovascular: Negative.   Gastrointestinal: Positive for nausea, vomiting and diarrhea. Negative for abdominal pain, constipation, blood in stool, abdominal distention, anal bleeding and rectal pain.       Objective:   Physical Exam  Constitutional: She appears well-developed and well-nourished.  Eyes: Conjunctivae are normal. No scleral icterus.  Cardiovascular: Normal rate, regular rhythm, normal heart sounds and intact distal pulses.   Pulmonary/Chest: Effort normal and breath sounds normal.  Abdominal: Soft. Bowel sounds are normal. She exhibits no distension and no mass. There is no tenderness. There is no rebound and no guarding.          Assessment & Plan:  Viral enteritis. Given a shot of Phenergan. Use Zofran and Lomotil prn. Drink Gatorade and water.

## 2015-08-31 NOTE — Telephone Encounter (Signed)
Patient Name: Shelley Jones DOB: May 11, 1951 Initial Comment Caller states started having diarrhea around 2 am, been vomiting, has a fever and body aches, feels like she pulled something in her back, burning inside her vagina Nurse Assessment Nurse: Ronnald Ramp, RN, Miranda Date/Time (Eastern Time): 08/31/2015 10:42:45 AM Confirm and document reason for call. If symptomatic, describe symptoms. ---Caller states she is having severe diarrhea since the middle of the night. She has also vomited x 3. She has a fever and body aches. She is having burning in her vagina. Temp 100.0 Has the patient traveled out of the country within the last 30 days? ---No Does the patient have any new or worsening symptoms? ---Yes Will a triage be completed? ---Yes Related visit to physician within the last 2 weeks? ---No Does the PT have any chronic conditions? (i.e. diabetes, asthma, etc.) ---Yes List chronic conditions. ---IC, Diverticulitis, GERD. Guidelines Guideline Title Affirmed Question Affirmed Notes Diarrhea [1] SEVERE diarrhea (e.g., 7 or more times / day more than normal) AND [2] age > 60 years Final Disposition User See Physician within 4 Hours (or PCP triage) Ronnald Ramp, RN, Miranda Comments Appt scheduled for 2:15 pm with Dr. Sarajane Jews Referrals REFERRED TO PCP OFFICE Disagree/Comply: Comply

## 2015-08-31 NOTE — Telephone Encounter (Signed)
FYI. Patient scheduled today with Dr. Sarajane Jews at 2:15

## 2015-08-31 NOTE — Progress Notes (Signed)
Pre visit review using our clinic review tool, if applicable. No additional management support is needed unless otherwise documented below in the visit note. Pt unable to weigh 

## 2015-08-31 NOTE — Addendum Note (Signed)
Addended by: Aggie Hacker A on: 08/31/2015 04:29 PM   Modules accepted: Orders

## 2015-12-17 ENCOUNTER — Other Ambulatory Visit (INDEPENDENT_AMBULATORY_CARE_PROVIDER_SITE_OTHER): Payer: BC Managed Care – PPO

## 2015-12-17 DIAGNOSIS — Z0000 Encounter for general adult medical examination without abnormal findings: Secondary | ICD-10-CM | POA: Diagnosis not present

## 2015-12-17 LAB — BASIC METABOLIC PANEL
BUN: 11 mg/dL (ref 6–23)
CALCIUM: 9.6 mg/dL (ref 8.4–10.5)
CO2: 29 meq/L (ref 19–32)
CREATININE: 0.9 mg/dL (ref 0.40–1.20)
Chloride: 106 mEq/L (ref 96–112)
GFR: 66.99 mL/min (ref >60.00)
GLUCOSE: 94 mg/dL (ref 70–99)
Potassium: 4.8 mEq/L (ref 3.5–5.1)
Sodium: 141 mEq/L (ref 135–145)

## 2015-12-17 LAB — LIPID PANEL
Cholesterol: 246 mg/dL — ABNORMAL HIGH (ref 0–200)
HDL: 45.9 mg/dL (ref >39.00)
LDL Cholesterol: 179 mg/dL — ABNORMAL HIGH (ref 0–99)
NONHDL: 199.98
Total CHOL/HDL Ratio: 5
Triglycerides: 105 mg/dL (ref 0.0–149.0)
VLDL: 21 mg/dL (ref 0.0–40.0)

## 2015-12-17 LAB — HEPATIC FUNCTION PANEL
ALBUMIN: 4 g/dL (ref 3.5–5.2)
ALT: 15 U/L (ref 0–35)
AST: 15 U/L (ref 0–37)
Alkaline Phosphatase: 79 U/L (ref 39–117)
Bilirubin, Direct: 0.1 mg/dL (ref 0.0–0.3)
Total Bilirubin: 0.5 mg/dL (ref 0.2–1.2)
Total Protein: 6.6 g/dL (ref 6.0–8.3)

## 2015-12-17 LAB — TSH: TSH: 2.61 u[IU]/mL (ref 0.35–4.50)

## 2015-12-17 LAB — CBC WITH DIFFERENTIAL/PLATELET
BASOS ABS: 0.1 10*3/uL (ref 0.0–0.1)
Basophils Relative: 0.8 % (ref 0.0–3.0)
EOS ABS: 0.3 10*3/uL (ref 0.0–0.7)
Eosinophils Relative: 4.5 % (ref 0.0–5.0)
HCT: 40.8 % (ref 36.0–46.0)
Hemoglobin: 13.6 g/dL (ref 12.0–15.0)
LYMPHS ABS: 2.5 10*3/uL (ref 0.7–4.0)
Lymphocytes Relative: 33.5 % (ref 12.0–46.0)
MCHC: 33.3 g/dL (ref 30.0–36.0)
MCV: 90.4 fl (ref 78.0–100.0)
Monocytes Absolute: 0.7 10*3/uL (ref 0.1–1.0)
Monocytes Relative: 9.1 % (ref 3.0–12.0)
NEUTROS ABS: 3.9 10*3/uL (ref 1.4–7.7)
Neutrophils Relative %: 52.1 % (ref 43.0–77.0)
PLATELETS: 300 10*3/uL (ref 150.0–400.0)
RBC: 4.51 Mil/uL (ref 3.87–5.11)
RDW: 13.6 % (ref 11.5–15.5)
WBC: 7.5 10*3/uL (ref 4.0–10.5)

## 2015-12-17 LAB — POC URINALSYSI DIPSTICK (AUTOMATED)
Bilirubin, UA: NEGATIVE
Blood, UA: NEGATIVE
GLUCOSE UA: NEGATIVE
Ketones, UA: NEGATIVE
NITRITE UA: NEGATIVE
PH UA: 6
PROTEIN UA: NEGATIVE
Spec Grav, UA: 1.025
Urobilinogen, UA: 0.2

## 2015-12-17 LAB — T3, FREE: T3 FREE: 3.2 pg/mL (ref 2.3–4.2)

## 2015-12-17 LAB — T4, FREE: FREE T4: 0.93 ng/dL (ref 0.60–1.60)

## 2015-12-31 ENCOUNTER — Ambulatory Visit (INDEPENDENT_AMBULATORY_CARE_PROVIDER_SITE_OTHER): Payer: BC Managed Care – PPO | Admitting: Family Medicine

## 2015-12-31 ENCOUNTER — Encounter: Payer: Self-pay | Admitting: Family Medicine

## 2015-12-31 VITALS — BP 155/91 | HR 63 | Temp 98.4°F | Ht 60.25 in | Wt 152.0 lb

## 2015-12-31 DIAGNOSIS — Z0000 Encounter for general adult medical examination without abnormal findings: Secondary | ICD-10-CM

## 2015-12-31 MED ORDER — ROSUVASTATIN CALCIUM 10 MG PO TABS: 10.0000 mg | ORAL_TABLET | Freq: Every day | ORAL | Status: DC

## 2015-12-31 MED ORDER — ALBUTEROL SULFATE HFA 108 (90 BASE) MCG/ACT IN AERS: 2.0000 | INHALATION_SPRAY | RESPIRATORY_TRACT | Status: DC | PRN

## 2015-12-31 NOTE — Progress Notes (Signed)
   Subjective:    Patient ID: Shelley Jones, female    DOB: 1951-01-18, 65 y.o.   MRN: [id number]  HPI 65 yr old female for a cpx. She feels well and has no complaints. Her GI system has really settled down in the past 6 months and she is able to eat a wide diet without the bloating and the pain that once plagued her. She took a Cologuard test on 02-05-15 since she cannot have colonoscopies, and this was negative. She does mention that her hair is thinning and asks advice. She already takes Biotin supplements with little effect.    Review of Systems  Constitutional: Negative.   HENT: Negative.   Eyes: Negative.   Respiratory: Negative.   Cardiovascular: Negative.   Gastrointestinal: Negative.   Genitourinary: Negative for dysuria, urgency, frequency, hematuria, flank pain, decreased urine volume, enuresis, difficulty urinating, pelvic pain and dyspareunia.  Musculoskeletal: Negative.   Skin: Negative.   Neurological: Negative.   Psychiatric/Behavioral: Negative.        Objective:   Physical Exam  Constitutional: She is oriented to person, place, and time. She appears well-developed and well-nourished. No distress.  HENT:  Head: Normocephalic and atraumatic.  Right Ear: External ear normal.  Left Ear: External ear normal.  Nose: Nose normal.  Mouth/Throat: Oropharynx is clear and moist. No oropharyngeal exudate.  Eyes: Conjunctivae and EOM are normal. Pupils are equal, round, and reactive to light. No scleral icterus.  Neck: Normal range of motion. Neck supple. No JVD present. No thyromegaly present.  Cardiovascular: Normal rate, regular rhythm, normal heart sounds and intact distal pulses.  Exam reveals no gallop and no friction rub.   No murmur heard. Pulmonary/Chest: Effort normal and breath sounds normal. No respiratory distress. She has no wheezes. She has no rales. She exhibits no tenderness.  Abdominal: Soft. Bowel sounds are normal. She exhibits no distension and no  mass. There is no tenderness. There is no rebound and no guarding.  Musculoskeletal: Normal range of motion. She exhibits no edema or tenderness.  Lymphadenopathy:    She has no cervical adenopathy.  Neurological: She is alert and oriented to person, place, and time. She has normal reflexes. No cranial nerve deficit. She exhibits normal muscle tone. Coordination normal.  Skin: Skin is warm and dry. No rash noted. No erythema.  Psychiatric: She has a normal mood and affect. Her behavior is normal. Judgment and thought content normal.          Assessment & Plan:  Well exam. We discussed diet and exercise. I suggested she try Rogaine shampoos for her thinning hair.

## 2015-12-31 NOTE — Progress Notes (Signed)
Pre visit review using our clinic review tool, if applicable. No additional management support is needed unless otherwise documented below in the visit note. 

## 2016-01-11 ENCOUNTER — Ambulatory Visit (INDEPENDENT_AMBULATORY_CARE_PROVIDER_SITE_OTHER): Payer: BC Managed Care – PPO | Admitting: Family Medicine

## 2016-01-11 ENCOUNTER — Other Ambulatory Visit: Payer: Self-pay | Admitting: Family Medicine

## 2016-01-11 ENCOUNTER — Encounter: Payer: Self-pay | Admitting: Family Medicine

## 2016-01-11 VITALS — BP 140/84 | HR 60 | Temp 98.6°F | Ht 60.25 in | Wt 152.0 lb

## 2016-01-11 DIAGNOSIS — G629 Polyneuropathy, unspecified: Secondary | ICD-10-CM

## 2016-01-11 DIAGNOSIS — R42 Dizziness and giddiness: Secondary | ICD-10-CM

## 2016-01-11 NOTE — Progress Notes (Signed)
   Subjective:    Patient ID: Shelley Jones, female    DOB: 12/02/1950, 65 y.o.   MRN: [id number]  HPI Here asking about several symptoms that have been getting worse. She started having some intermittent numbness or tingling or burning in the hands and feet about 8 months ago, and we discussed this last September.  We did extensive lab work then including a B12 level, and no etiology was found. These sensations have rapidly worsened in the past month or two, and she has developed some new symptoms. She often feels unstable on her feet, and this gets worse when she closes her eyes (as in when taking a shower). She often sometimes like she is falling to the right side, and has to hold into a wall to keep her balance. No vision changes. No headaches. No family history of neurologic diseases.    Review of Systems  Constitutional: Negative.   Respiratory: Negative.   Cardiovascular: Negative.   Neurological: Positive for dizziness, tremors and numbness. Negative for seizures, syncope, facial asymmetry, speech difficulty, weakness, light-headedness and headaches.       Objective:   Physical Exam  Constitutional: She is oriented to person, place, and time. She appears well-developed and well-nourished.  Cardiovascular: Normal rate, regular rhythm, normal heart sounds and intact distal pulses.   Pulmonary/Chest: Effort normal and breath sounds normal.  Neurological: She is alert and oriented to person, place, and time. She has normal reflexes. No cranial nerve deficit. She exhibits normal muscle tone.  She is slightly unsteady when walking but does not require assistance. 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 At her request we will check for Lyme disease. Refer to Neurology for further evaluation

## 2016-01-11 NOTE — Progress Notes (Signed)
Pre visit review using our clinic review tool, if applicable. No additional management support is needed unless otherwise documented below in the visit note. 

## 2016-01-14 ENCOUNTER — Ambulatory Visit: Payer: BC Managed Care – PPO | Admitting: Family Medicine

## 2016-01-14 LAB — LYME AB/WESTERN BLOT REFLEX: B burgdorferi Ab IgG+IgM: <0.9 Index (ref <0.90)

## 2016-01-15 ENCOUNTER — Telehealth: Payer: Self-pay | Admitting: Family Medicine

## 2016-01-15 NOTE — Telephone Encounter (Addendum)
Pt would like the results of the lyme disease test.  Pt was told these results would be back Monday.

## 2016-01-16 NOTE — Telephone Encounter (Signed)
I spoke with pt  

## 2016-01-21 ENCOUNTER — Telehealth: Payer: Self-pay | Admitting: Family Medicine

## 2016-01-21 DIAGNOSIS — R42 Dizziness and giddiness: Secondary | ICD-10-CM

## 2016-01-21 DIAGNOSIS — G629 Polyneuropathy, unspecified: Secondary | ICD-10-CM

## 2016-01-21 NOTE — Telephone Encounter (Signed)
Pt states the referral to [facility] neuro referral is too far out. Pt refused appointment with them. Pt feels like she does need to wait much longer. Pt states she is having back pain, major clicking in neck.  Pt would like a new referral to [facility] asap.

## 2016-01-21 NOTE — Telephone Encounter (Signed)
I did an urgent referral to [facility]

## 2016-01-23 ENCOUNTER — Encounter: Payer: Self-pay | Admitting: Diagnostic Neuroimaging

## 2016-01-23 ENCOUNTER — Ambulatory Visit (INDEPENDENT_AMBULATORY_CARE_PROVIDER_SITE_OTHER): Payer: BC Managed Care – PPO | Admitting: Diagnostic Neuroimaging

## 2016-01-23 VITALS — BP 165/81 | HR 62 | Ht 60.5 in | Wt 150.6 lb

## 2016-01-23 DIAGNOSIS — R202 Paresthesia of skin: Secondary | ICD-10-CM | POA: Diagnosis not present

## 2016-01-23 DIAGNOSIS — G4733 Obstructive sleep apnea (adult) (pediatric): Secondary | ICD-10-CM

## 2016-01-23 DIAGNOSIS — M5416 Radiculopathy, lumbar region: Secondary | ICD-10-CM

## 2016-01-23 DIAGNOSIS — R200 Anesthesia of skin: Secondary | ICD-10-CM

## 2016-01-23 NOTE — Patient Instructions (Addendum)
Thank you for coming to see [place] at [facility]. I hope we have been able to provide you high quality care today.  You may receive a patient satisfaction survey over the next few weeks. We would appreciate your feedback and comments so that we may continue to improve ourselves and the health of our patients.  - I will check MRI brain and lumbar spine - I will refer you for a sleep study   ~~~~~~~~~~~~~~~~~~~~~~~~~~~~~~~~~~~~~~~~~~~~~~~~~~~~~~~~~~~~~~~~~  DR. Christionna Poland'S GUIDE TO HAPPY AND HEALTHY LIVING These are some of my general health and wellness recommendations. Some of them may apply to you better than others. Please use common sense as you try these suggestions and feel free to ask me any questions.   ACTIVITY/FITNESS Mental, social, emotional and physical stimulation are very important for brain and body health. Try learning a new activity (arts, music, language, sports, games).  Keep moving your body to the best of your abilities. You can do this at home, inside or outside, the park, community center, gym or anywhere you like. Consider a physical therapist or personal trainer to get started. Consider the app Sworkit. Fitness trackers such as smart-watches, smart-phones or Fitbits can help as well.   NUTRITION Eat more plants: colorful vegetables, nuts, seeds and berries.  Eat less sugar, salt, preservatives and processed foods.  Avoid toxins such as cigarettes and alcohol.  Drink water when you are thirsty. Warm water with a slice of lemon is an excellent morning drink to start the day.  Consider these websites for more information The Nutrition Source (https://www.henry-hernandez.biz/) Precision Nutrition (WindowBlog.ch)   RELAXATION Consider practicing mindfulness meditation or other relaxation techniques such as deep breathing, prayer, yoga, tai chi, massage. See website mindful.org or the apps Headspace or  Calm to help get started.   SLEEP Try to get at least 7-8+ hours sleep per day. Regular exercise and reduced caffeine will help you sleep better. Practice good sleep hygeine techniques. See website sleep.org for more information.   PLANNING Prepare estate planning, living will, healthcare POA documents. Sometimes this is best planned with the help of an attorney. Theconversationproject.org and agingwithdignity.org are excellent resources.
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
## 2016-01-30 ENCOUNTER — Ambulatory Visit (INDEPENDENT_AMBULATORY_CARE_PROVIDER_SITE_OTHER): Payer: BC Managed Care – PPO | Admitting: Neurology

## 2016-01-30 ENCOUNTER — Encounter: Payer: Self-pay | Admitting: Neurology

## 2016-01-30 VITALS — BP 140/84 | HR 68 | Resp 20 | Ht 61.5 in | Wt 150.0 lb

## 2016-01-30 DIAGNOSIS — R0683 Snoring: Secondary | ICD-10-CM | POA: Diagnosis not present

## 2016-01-30 DIAGNOSIS — G4730 Sleep apnea, unspecified: Secondary | ICD-10-CM | POA: Diagnosis not present

## 2016-01-30 DIAGNOSIS — G4700 Insomnia, unspecified: Secondary | ICD-10-CM | POA: Insufficient documentation
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
## 2016-01-30 NOTE — Patient Instructions (Signed)

## 2016-02-05 ENCOUNTER — Telehealth: Payer: Self-pay | Admitting: Diagnostic Neuroimaging

## 2016-02-05 NOTE — Telephone Encounter (Signed)
Pt called said she could not find her credit card and thought she might have left it at the front when she paid her co-pay. Shelley Jones checked the drawer and with the ladies at the front but her card was not here.

## 2016-02-07 ENCOUNTER — Inpatient Hospital Stay: Admission: RE | Admit: 2016-02-07 | Payer: BC Managed Care – PPO | Source: Ambulatory Visit

## 2016-02-07 ENCOUNTER — Other Ambulatory Visit: Payer: BC Managed Care – PPO

## 2016-02-12 ENCOUNTER — Ambulatory Visit (INDEPENDENT_AMBULATORY_CARE_PROVIDER_SITE_OTHER): Payer: BC Managed Care – PPO | Admitting: Family Medicine

## 2016-02-12 ENCOUNTER — Encounter: Payer: Self-pay | Admitting: Family Medicine

## 2016-02-12 VITALS — BP 150/80 | Temp 98.7°F | Ht 61.5 in | Wt 150.0 lb

## 2016-02-12 DIAGNOSIS — R059 Cough, unspecified: Secondary | ICD-10-CM

## 2016-02-12 DIAGNOSIS — N390 Urinary tract infection, site not specified: Secondary | ICD-10-CM

## 2016-02-12 DIAGNOSIS — R8290 Unspecified abnormal findings in urine: Secondary | ICD-10-CM

## 2016-02-12 DIAGNOSIS — R05 Cough: Secondary | ICD-10-CM | POA: Diagnosis not present

## 2016-02-12 MED ORDER — VENTOLIN HFA 108 (90 BASE) MCG/ACT IN AERS: 2.0000 | INHALATION_SPRAY | RESPIRATORY_TRACT | Status: AC | PRN

## 2016-02-12 NOTE — Progress Notes (Signed)
Pre visit review using our clinic review tool, if applicable. No additional management support is needed unless otherwise documented below in the visit note. 

## 2016-02-12 NOTE — Progress Notes (Signed)
   Subjective:    Patient ID: Shelley Jones, female    DOB: [birth year], 65 y.o.   MRN: [id number]  HPI Here for several days of a foul smell to the urine. No fever. She often has burning on urination from her IC, so nothing different as far as that goes.    Review of Systems  Constitutional: Negative.   Genitourinary: Positive for dysuria, urgency and frequency. Negative for hematuria, flank pain and difficulty urinating.       Objective:   Physical Exam  Constitutional: She appears well-developed and well-nourished.  Abdominal: Soft. Bowel sounds are normal. She exhibits no distension and no mass. There is no tenderness. There is no rebound and no guarding.          Assessment & Plan:  UTI. Drink fluids. Culture the sample. She wants to take a specific antibiotic that she has received from her Urologist, so she will find out what this was and let us know  Laurey Morale, MD

## 2016-02-13 LAB — URINE CULTURE
Colony Count: NO GROWTH
Organism ID, Bacteria: NO GROWTH

## 2016-02-14 ENCOUNTER — Telehealth: Payer: Self-pay | Admitting: Family Medicine

## 2016-02-14 NOTE — Telephone Encounter (Addendum)
Pt was seen on 4-25 and needs urine culture result. Pt had not started taking  cipro 500 mg that  her gyn called  into her pharm. Pt is waiting on results first

## 2016-02-15 NOTE — Telephone Encounter (Signed)
I left a voice message with normal results.  

## 2016-02-15 NOTE — Telephone Encounter (Signed)
Pt was diagnosed with UTI during recent office visit on 02/12/2016. Pt did get a script from GYN for Cipro, per Dr. Sarajane Jews pt should start on this antibiotic due to results that day.

## 2016-02-16 ENCOUNTER — Ambulatory Visit
Admission: RE | Admit: 2016-02-16 | Discharge: 2016-02-16 | Disposition: A | Discharge status: Home / Self Care | Payer: BC Managed Care – PPO | Source: Ambulatory Visit | Attending: Diagnostic Neuroimaging | Admitting: Diagnostic Neuroimaging

## 2016-02-16 DIAGNOSIS — R202 Paresthesia of skin: Principal | ICD-10-CM

## 2016-02-16 DIAGNOSIS — R200 Anesthesia of skin: Secondary | ICD-10-CM

## 2016-02-16 MED ORDER — GADOBENATE DIMEGLUMINE 529 MG/ML IV SOLN
15.0000 mL | Freq: Once | INTRAVENOUS | Status: AC | PRN
  Administered 2016-02-16: 14 mL via INTRAVENOUS

## 2016-02-18 ENCOUNTER — Ambulatory Visit
Admission: RE | Admit: 2016-02-18 | Discharge: 2016-02-18 | Disposition: A | Discharge status: Home / Self Care | Payer: BC Managed Care – PPO | Source: Ambulatory Visit | Attending: Diagnostic Neuroimaging | Admitting: Diagnostic Neuroimaging

## 2016-02-18 ENCOUNTER — Telehealth: Payer: Self-pay | Admitting: *Deleted

## 2016-02-18 DIAGNOSIS — M5416 Radiculopathy, lumbar region: Secondary | ICD-10-CM

## 2016-02-18 DIAGNOSIS — R200 Anesthesia of skin: Secondary | ICD-10-CM

## 2016-02-18 DIAGNOSIS — R202 Paresthesia of skin: Secondary | ICD-10-CM | POA: Diagnosis not present

## 2016-02-18 NOTE — Telephone Encounter (Signed)
LVM requesting call back for results. 

## 2016-02-19 NOTE — Telephone Encounter (Signed)
Spoke with husband who is on DPR and gave him MRI brain results per Dr Gladstone Lighter note. He inquired about MRI lumbar spine done yesterday; informed him results are not ready. He  requested she be called when those results are ready. Informed him that she will receive call. He verbalized understanding, appreciation.

## 2016-02-19 NOTE — Telephone Encounter (Signed)
Pt returned call. Please call back.

## 2016-02-21 ENCOUNTER — Telehealth: Payer: Self-pay | Admitting: *Deleted

## 2016-02-21 NOTE — Telephone Encounter (Signed)
Spoke with patient's husband and relayed Dr Gladstone Lighter message re: results of MRI lumbar spine: Mild degeneration and arthritis. No need for surgery treatment. Continue current plan. Reminded him of her FU 03/06/16. He verbalized understanding, appreciation.

## 2016-02-28 ENCOUNTER — Ambulatory Visit: Payer: BC Managed Care – PPO | Admitting: Neurology

## 2016-02-29 ENCOUNTER — Telehealth: Payer: Self-pay

## 2016-02-29 NOTE — Telephone Encounter (Signed)
Patient came to sleep lab for Split night study. After getting patient hooked up she had to make several trips to bathroom. The third time she went equipment went down. Spent an hour trying to fix. Had to move her to another room. She then needed to go to restroom again. She is diagnosed with Interstitial cystitis. She felt this was getting worse over last few days. We attempted sleep study again but within few minutes she had to go again. She felt she was not going to sleep and needed to go home. With equipment failure and her many trips to bathroom we were running out of time for study. I sent her a home study home to use over weekend.

## 2016-02-29 NOTE — Telephone Encounter (Signed)
I am so sorry for Shelley Jones- interstitial cystitis is painfull. Usually treated with Valium. Hope the HST will help. Arta Bruce.

## 2016-03-06 ENCOUNTER — Encounter: Payer: Self-pay | Admitting: Diagnostic Neuroimaging

## 2016-03-06 ENCOUNTER — Ambulatory Visit (INDEPENDENT_AMBULATORY_CARE_PROVIDER_SITE_OTHER): Payer: BC Managed Care – PPO | Admitting: Diagnostic Neuroimaging

## 2016-03-06 VITALS — BP 142/72 | HR 68 | Ht 61.5 in | Wt 150.6 lb

## 2016-03-06 DIAGNOSIS — M5416 Radiculopathy, lumbar region: Secondary | ICD-10-CM

## 2016-03-06 NOTE — Progress Notes (Signed)
[facility]  PATIENT: Shelley Jones DOB: 06/13/1951  REFERRING CLINICIAN: Barbie Banner HISTORY FROM: patient  REASON FOR VISIT: follow up   HISTORICAL  CHIEF COMPLAINT:  Chief Complaint  Patient presents with  . Numbness and tingling    rm 6, "fell 2 days ago, hurt right lower back, saw chiropractor w/good relief"  . Follow-up    6 week    HISTORY OF PRESENT ILLNESS:   UPDATE 03/06/16: Since last visit, patient is stable. MRI results reviewed. She slipped in a strawberry field, now having some right hip pain.   PRIOR HPI (02/12/16): 65 year old right-handed female here for evaluation of intermittent numbness, tingling. Since age 65 years old patient has had intermittent migratory numbness and tingling sensations. Initially this was noted in her left fingers when left shoulder. In 2016 she noticed some left foot numbness. She's also had intermittent heat sensations in her left abdomen lasting a minute or and hours a time. She has had right face numbness as well. Around Christmas time 2016 patient had some heat sensation in her right foot lasting for 10 minutes, intermittently coming and going. No specific triggering factors.   REVIEW OF SYSTEMS: Full 14 system review of systems performed and negative with exception of: Insomnia sleepiness restless legs numbness dizziness not enough sleep frequent infections urination problem shortness of breath feeling hot blurred vision palpitations spinning dizzy sensation.  ALLERGIES: Allergies  Allergen Reactions  . Midazolam Shortness Of Breath    Reaction: unknown  . Nalbuphine Shortness Of Breath    Reaction: unknown SOB  . Cefdinir     Constipation & abdominal pain  . Iodine Itching and Swelling  . 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  She received benadryl and was ok afterward.   . Lipitor [Atorvastatin]    myalgias  . Meperidine Hcl Nausea And Vomiting  . Novocain [Procaine]     With epinephrine  . Prednisone     Heart races  . Clarithromycin Swelling and Rash    HOME MEDICATIONS: Outpatient Prescriptions Prior to Visit  Medication Sig Dispense Refill  . ALPRAZolam (XANAX) 0.25 MG tablet Take 1 tablet (0.25 mg total) by mouth 3 (three) times daily as needed for anxiety. (Patient taking differently: Take 0.25 mg by mouth 3 (three) times daily as needed for anxiety. 1/2 tablet) 60 tablet 2  . BIOTIN PO Take 500 mg by mouth daily.    . calcium carbonate (TUMS EX) 750 MG chewable tablet Chew 2 tablets by mouth daily.     . cyanocobalamin 500 MCG tablet Take 500 mcg by mouth daily.    . hydrOXYzine (ATARAX/VISTARIL) 10 MG tablet Take 1 tablet (10 mg total) by mouth at bedtime. 30 tablet 0  . MAGNESIUM PO Take 500 mg by mouth daily.    Marland Kitchen MELATONIN PO Take 2 capsules by mouth at bedtime.    . Multiple Vitamins-Minerals (MULTIVITAMIN ADULT PO) Take by mouth daily.    . nitrofurantoin (MACRODANTIN) 50 MG capsule Take 50 mg by mouth 4 (four) times daily. Reported on 02/12/2016    . ranitidine (ZANTAC) 75 MG tablet Take 75 mg by mouth as needed.     . rosuvastatin (CRESTOR) 10 MG tablet Take 1 tablet (10 mg total) by mouth daily. (Patient taking differently: Take 10 mg by mouth every other day. ) 90 tablet 3  .  UNABLE TO FIND Med Name: nioxin to scalp for thin hair    . URELLE (URELLE/URISED) 81 MG TABS tablet Take 1 tablet by mouth. Reported on 02/12/2016    . valACYclovir (VALTREX) 500 MG tablet Take 500 mg by mouth 2 (two) times daily. As needed    . VENTOLIN HFA 108 (90 Base) MCG/ACT inhaler Inhale 2 puffs into the lungs every 4 (four) hours as needed for wheezing or shortness of breath. 1 Inhaler 11  . VITAMIN D, CHOLECALCIFEROL, PO Take 500 mg by mouth daily.    . Wheat Dextrin (BENEFIBER PO) Take by mouth daily.     No facility-administered medications prior to visit.    PAST MEDICAL  HISTORY: Past Medical History  Diagnosis Date  . IBS (irritable bowel syndrome)   . Allergic rhinitis   . Asthma   . DVT (deep venous thrombosis) ([place])   . Hyperlipidemia   . Uterine fibroids affecting pregnancy   . Gynecological examination     sees Dr. Corinna Capra  . Fibromyalgia   . Diverticulitis     sees Dr. Deatra Ina  . Anxiety   . Restless leg   . Interstitial cystitis     sees Dr. Ellard Artis   . Shingles   . Proctitis   . Colitis     PAST SURGICAL HISTORY: Past Surgical History  Procedure Laterality Date  . Tonsillectomy    . Cholecystectomy    . Colonoscopy      all attempts at a colonoscopy (last in 1995) were unsuccessful due to a very tortuous colon  . Uterine fibroid surgery  08/2015    FAMILY HISTORY: Family History  Problem Relation Age of Onset  . Glaucoma Mother   . Heart attack Mother   . Stroke Mother   . Hypertension Other   . Cancer Other     bone, colon  . Diabetes Other     SOCIAL HISTORY:  Social History   Social History  . Marital Status: Married    Spouse Name: Eulas Post  . Number of Children: 2  . Years of Education: 17   Occupational History  .      sub teacher, retired full Theatre manager   Social History Main Topics  . Smoking status: Never Smoker   . Smokeless tobacco: Never Used  . Alcohol Use: No  . Drug Use: No  . Sexual Activity: Not on file   Other Topics Concern  . Not on file   Social History Narrative   Lives at home with husband   Caffeine use- tea, 2 16 oz daily     PHYSICAL EXAM  GENERAL EXAM/CONSTITUTIONAL: Vitals:  Filed Vitals:   03/06/16 1453  BP: 142/72  Pulse: 68  Height: 5' 1.5" (1.562 m)  Weight: 150 lb 9.6 oz (68.312 kg)   Body mass index is 28 kg/(m^2). No exam data present  Patient is in no distress; well developed, nourished and groomed; neck is supple  CARDIOVASCULAR:  Examination of carotid arteries is normal; no carotid bruits  Regular rate and rhythm, no murmurs  Examination of  peripheral vascular system by observation and palpation is normal  EYES:  Ophthalmoscopic exam of optic discs and posterior segments is normal; no papilledema or hemorrhages  MUSCULOSKELETAL:  Gait, strength, tone, movements noted in Neurologic exam below  NEUROLOGIC: MENTAL STATUS:  No flowsheet data found.  awake, alert, oriented to person, place and time  recent and remote memory intact  normal attention and concentration  language fluent, comprehension  intact, naming intact,   fund of knowledge appropriate  PRESSURED SPEECH  CRANIAL NERVE:   2nd - no papilledema on fundoscopic exam  2nd, 3rd, 4th, 6th - pupils equal and reactive to light, visual fields full to confrontation, extraocular muscles intact, no nystagmus  5th - facial sensation symmetric  7th - facial strength symmetric  8th - hearing intact  9th - palate elevates symmetrically, uvula midline  11th - shoulder shrug symmetric  12th - tongue protrusion midline  MOTOR:   normal bulk and tone, full strength in the BUE, BLE  SENSORY:   normal and symmetric to light touch, pinprick, temperature, vibration; EXCEPT SUBTLE DECR PP IN LEFT FOOT TOES  COORDINATION:   finger-nose-finger, fine finger movements normal  REFLEXES:   deep tendon reflexes present and symmetric  GAIT/STATION:   narrow based gait; able to walk on toes, heels and tandem; romberg is negative     DIAGNOSTIC DATA (LABS, IMAGING, TESTING) - I reviewed patient records, labs, notes, testing and imaging myself where available.  Lab Results  Component Value Date   WBC 7.5 12/17/2015   HGB 13.6 12/17/2015   HCT 40.8 12/17/2015   MCV 90.4 12/17/2015   PLT 300.0 12/17/2015      Component Value Date/Time   NA 141 12/17/2015 0915   K 4.8 12/17/2015 0915   CL 106 12/17/2015 0915   CO2 29 12/17/2015 0915   GLUCOSE 94 12/17/2015 0915   BUN 11 12/17/2015 0915   CREATININE 0.90 12/17/2015 0915   CALCIUM 9.6 12/17/2015  0915   PROT 6.6 12/17/2015 0915   ALBUMIN 4.0 12/17/2015 0915   AST 15 12/17/2015 0915   ALT 15 12/17/2015 0915   ALKPHOS 79 12/17/2015 0915   BILITOT 0.5 12/17/2015 0915   GFRNONAA >60 03/23/2015 0809   GFRAA >60 03/23/2015 0809   Lab Results  Component Value Date   CHOL 246* 12/17/2015   HDL 45.90 12/17/2015   LDLCALC 179* 12/17/2015   LDLDIRECT 189.1 12/14/2013   TRIG 105.0 12/17/2015   CHOLHDL 5 12/17/2015   Lab Results  Component Value Date   HGBA1C 5.6 07/10/2015   Lab Results  Component Value Date   VITAMINB12 966* 07/10/2015   Lab Results  Component Value Date   TSH 2.61 12/17/2015   01/11/16  B burgdorferi Ab IgG+IgM <0.90 Index <0.90   Comments:            Index        Interpretation           -----        --------------           < 0.90        NEGATIVE           0.90-1.09      EQUIVOCAL           > 1.09        POSITIVE         03/24/15 EKG [I reviewed images myself and agree with interpretation. -VRP]  - normal sinus rhythm  03/23/15 CXR [I reviewed images myself and agree with interpretation. -VRP]  - No edema or consolidation. Minimal left base atelectasis  02/16/16 MRI brain (with and without) demonstrating: 1. Few subcortical foci of non-specific gliosis. These findings are non-specific and considerations include autoimmune, inflammatory, post-infectious, microvascular ischemic or migraine associated etiologies.  2. No abnormal lesions on post-contrast views.  3. No acute findings.   02/18/16 MRI lumbar spine (without) demonstrating: 1.  At L3-4, L4-5: disc bulging and facet hypertrophy with mild biforaminal stenosis. 2. Mild multi-level degenerative changes as above.     ASSESSMENT AND PLAN  65 y.o. year old female here with intermittent migratory numbness and tingling since age 65 years old; but worse in last 1 year. Patient also has physical exam findings  which suggest left lumbar radiculopathy.   Ddx: intermittent nerve compression / lumbar radiculopathy / piriformis muscle  1. Lumbar radiculopathy     PLAN: - I will setup physical therapy  Orders Placed This Encounter  Procedures  . Ambulatory referral to Physical Therapy   Return in about 6 months (around 09/06/2016).    Penni Bombard, MD [id number], [id number] PM Certified in Neurology, Neurophysiology and Neuroimaging  [facility] [address], [place], [place] [address] [phone]

## 2016-03-12 ENCOUNTER — Telehealth: Payer: Self-pay

## 2016-03-12 NOTE — Telephone Encounter (Signed)
Patient arrived to sleep lab on 02-28-16 for PSG. Hooked patient up and was ready for bed at 9pm. Patient had to go to bathroom. She is diagnosed with Interestitial Cystitis. She had to use bathroom 5 times before light out. She could not sleep and felt that this was nota good night for sleep study due to her bathroom trips. She felt it was getting worse and needed to leave. Her husband came to pick her up. I sent a home study home with her to use. I submitted authorization and it was denied. Husband returned unit today. Patient got letter of denial and did not use equipment. She will follow-up with doctor.

## 2016-04-26 ENCOUNTER — Other Ambulatory Visit: Payer: Self-pay

## 2016-04-26 ENCOUNTER — Encounter: Payer: Self-pay | Admitting: Emergency Medicine

## 2016-04-26 ENCOUNTER — Emergency Department
Admission: EM | Admit: 2016-04-26 | Discharge: 2016-04-26 | Disposition: A | Discharge status: Home / Self Care | Payer: BC Managed Care – PPO | Attending: Emergency Medicine | Admitting: Emergency Medicine

## 2016-04-26 DIAGNOSIS — R079 Chest pain, unspecified: Secondary | ICD-10-CM | POA: Insufficient documentation

## 2016-04-26 DIAGNOSIS — Z5321 Procedure and treatment not carried out due to patient leaving prior to being seen by health care provider: Secondary | ICD-10-CM | POA: Insufficient documentation

## 2016-04-26 LAB — CBC
HCT: 42.4 % (ref 35.0–47.0)
Hemoglobin: 14.5 g/dL (ref 12.0–16.0)
MCH: 31 pg (ref 26.0–34.0)
MCHC: 34.2 g/dL (ref 32.0–36.0)
MCV: 90.4 fL (ref 80.0–100.0)
PLATELETS: 264 10*3/uL (ref 150–440)
RBC: 4.69 MIL/uL (ref 3.80–5.20)
RDW: 13.2 % (ref 11.5–14.5)
WBC: 8.4 10*3/uL (ref 3.6–11.0)

## 2016-04-26 LAB — COMPREHENSIVE METABOLIC PANEL
ALBUMIN: 4.5 g/dL (ref 3.5–5.0)
ALT: 23 U/L (ref 14–54)
AST: 26 U/L (ref 15–41)
Alkaline Phosphatase: 79 U/L (ref 38–126)
Anion gap: 9 (ref 5–15)
BUN: 21 mg/dL — AB (ref 6–20)
CHLORIDE: 106 mmol/L (ref 101–111)
CO2: 25 mmol/L (ref 22–32)
Calcium: 9.9 mg/dL (ref 8.9–10.3)
Creatinine, Ser: 1.06 mg/dL — ABNORMAL HIGH (ref 0.44–1.00)
GFR calc Af Amer: >60 mL/min (ref >60)
GFR, EST NON AFRICAN AMERICAN: 54 mL/min — AB (ref >60)
Glucose, Bld: 97 mg/dL (ref 65–99)
POTASSIUM: 3.6 mmol/L (ref 3.5–5.1)
SODIUM: 140 mmol/L (ref 135–145)
Total Bilirubin: 1 mg/dL (ref 0.3–1.2)
Total Protein: 7.6 g/dL (ref 6.5–8.1)

## 2016-04-26 LAB — TROPONIN I

## 2016-04-26 MED ORDER — ONDANSETRON 4 MG PO TBDP
ORAL_TABLET | ORAL | Status: AC
  Filled 2016-04-26: qty 1

## 2016-04-26 MED ORDER — ONDANSETRON 4 MG PO TBDP
4.0000 mg | ORAL_TABLET | Freq: Once | ORAL | Status: AC
  Administered 2016-04-26: 4 mg via ORAL

## 2016-04-26 NOTE — ED Notes (Signed)
Lab results reviewed. Awaiting room for MD eval.  

## 2016-04-26 NOTE — ED Notes (Addendum)
States was eating at restaurant and felt choking sensation, threw up at restaurant, did not relieve sensation. States can swallow own saliva. Takes Mylanta during triage, stays down. Constant belching in triage.

## 2016-04-27 ENCOUNTER — Emergency Department
Admission: EM | Admit: 2016-04-27 | Discharge: 2016-04-27 | Disposition: A | Discharge status: Home / Self Care | Payer: BC Managed Care – PPO | Attending: Emergency Medicine | Admitting: Emergency Medicine

## 2016-04-27 ENCOUNTER — Emergency Department: Payer: BC Managed Care – PPO

## 2016-04-27 DIAGNOSIS — K449 Diaphragmatic hernia without obstruction or gangrene: Secondary | ICD-10-CM

## 2016-04-27 DIAGNOSIS — Z792 Long term (current) use of antibiotics: Secondary | ICD-10-CM | POA: Insufficient documentation

## 2016-04-27 DIAGNOSIS — E785 Hyperlipidemia, unspecified: Secondary | ICD-10-CM | POA: Diagnosis not present

## 2016-04-27 DIAGNOSIS — J45909 Unspecified asthma, uncomplicated: Secondary | ICD-10-CM | POA: Diagnosis not present

## 2016-04-27 DIAGNOSIS — Z86718 Personal history of other venous thrombosis and embolism: Secondary | ICD-10-CM | POA: Insufficient documentation

## 2016-04-27 DIAGNOSIS — R0989 Other specified symptoms and signs involving the circulatory and respiratory systems: Secondary | ICD-10-CM | POA: Diagnosis present

## 2016-04-27 DIAGNOSIS — Z79899 Other long term (current) drug therapy: Secondary | ICD-10-CM | POA: Insufficient documentation

## 2016-04-27 DIAGNOSIS — K2100 Gastro-esophageal reflux disease with esophagitis, without bleeding: Secondary | ICD-10-CM

## 2016-04-27 MED ORDER — ALUM & MAG HYDROXIDE-SIMETH 200-200-20 MG/5ML PO SUSP
30.0000 mL | Freq: Once | ORAL | Status: DC
  Filled 2016-04-27: qty 30

## 2016-04-27 MED ORDER — SUCRALFATE 1 G PO TABS: 1.0000 g | ORAL_TABLET | Freq: Four times a day (QID) | ORAL | Status: DC

## 2016-04-27 MED ORDER — DICYCLOMINE HCL 20 MG PO TABS: 20.0000 mg | ORAL_TABLET | Freq: Three times a day (TID) | ORAL | Status: AC | PRN

## 2016-04-27 MED ORDER — GI COCKTAIL ~~LOC~~
30.0000 mL | ORAL | Status: DC
  Filled 2016-04-27: qty 30

## 2016-04-27 NOTE — ED Provider Notes (Signed)
[facility] Emergency Department Provider Note  ____________________________________________  Time seen: 2:45 PM  I have reviewed the triage vital signs and the nursing notes.   HISTORY  Chief Complaint Choking    HPI Shelley Jones is a 65 y.o. female who complains of what she describes as a choking episode that occurred yesterday while she was eating grits for breakfast. After eating a few bites of grits and bacon, she had a severe upper abdominal pain that felt like tightness like something was stuck and she felt nauseated. She went to the bathroom in the restaurant where she thought she was got throughout. A nurse helped her by rubbing her back and her tummy and she felt much better. She did get pale and lightheaded but did not pass out. She had a feeling of dysphagia as well. And has not tried to eat anything since that time yesterday morning.  She reports that this is happened to her before. She saw gastroenterology a year ago where they diagnosed her with dysphagia and a sliding hiatal hernia. She is supposed to take Nexium every day continuously, but she reports that after taking it for a few months at that time she discontinued it for the last 8 months. She just started retaking it again yesterday after the symptoms reoccurred. She also reports that Bentyl has helped in the past but she is currently out. She is due for follow-up with GI now, but has not yet scheduled an appointment. She was supposed to have an upper endoscopy a year ago, but she canceled the test due to cost concerns at the time.  Since yesterday the symptoms have resolved and not have not reoccurred. She has no current abdominal pain. No dizziness or lightheadedness chest pain shortness of breath fevers or chills per no vomiting or diarrhea.     Past Medical History  Diagnosis Date  . IBS (irritable bowel syndrome)   . Allergic rhinitis   . Asthma   . DVT (deep venous thrombosis)  ([place])   . Hyperlipidemia   . Uterine fibroids affecting pregnancy   . Gynecological examination     sees Dr. Corinna Capra  . Fibromyalgia   . Diverticulitis     sees Dr. Deatra Ina  . Anxiety   . Restless leg   . Interstitial cystitis     sees Dr. Ellard Artis   . Shingles   . Proctitis   . Colitis      Patient Active Problem List   Diagnosis Date Noted  . Insomnia w/ sleep apnea 01/30/2016  . Snoring 01/30/2016  . Abdominal pain, chronic, epigastric 01/12/2015  . Dysphagia, pharyngoesophageal phase 01/12/2015  . Special screening for malignant neoplasms, colon 01/12/2015  . GERD (gastroesophageal reflux disease) 01/09/2015  . Shingles 05/12/2014  . ACUTE BRONCHITIS 02/05/2010  . DIVERTICULITIS OF COLON 01/28/2010  . ACUTE SINUSITIS, UNSPECIFIED 11/21/2009  . INFLUENZA 11/21/2009  . ANXIETY 05/05/2008  . RESTLESS LEG SYNDROME 05/05/2008  . DIVERTICULITIS, HX OF 05/05/2008  . FIBROIDS, UTERUS 02/21/2008  . HYPERLIPIDEMIA 02/21/2008  . GENERALIZED ANXIETY DISORDER 02/21/2008  . ALLERGIC RHINITIS 02/21/2008  . ASTHMA 02/21/2008  . IBS 02/21/2008  . EPISTAXIS 02/21/2008  . DVT, HX OF 02/21/2008     Past Surgical History  Procedure Laterality Date  . Tonsillectomy    . Cholecystectomy    . Colonoscopy      all attempts at a colonoscopy (last in 1995) were unsuccessful due to a very tortuous colon  . Uterine fibroid surgery  08/2015     Current Outpatient Rx  Name  Route  Sig  Dispense  Refill  . ALPRAZolam (XANAX) 0.25 MG tablet   Oral   Take 1 tablet (0.25 mg total) by mouth 3 (three) times daily as needed for anxiety. Patient taking differently: Take 0.25 mg by mouth 3 (three) times daily as needed for anxiety. 1/2 tablet   60 tablet   2   . BIOTIN PO   Oral   Take 500 mg by mouth daily.         . calcium carbonate (TUMS EX) 750 MG chewable tablet   Oral   Chew 2 tablets by mouth daily.          . cyanocobalamin 500 MCG tablet   Oral   Take 500 mcg by mouth  daily.         Marland Kitchen dicyclomine (BENTYL) 20 MG tablet   Oral   Take 1 tablet (20 mg total) by mouth 3 (three) times daily as needed for spasms.   30 tablet   0   . hydrOXYzine (ATARAX/VISTARIL) 10 MG tablet   Oral   Take 1 tablet (10 mg total) by mouth at bedtime.   30 tablet   0   . MAGNESIUM PO   Oral   Take 500 mg by mouth daily.         Marland Kitchen MELATONIN PO   Oral   Take 2 capsules by mouth at bedtime.         . Multiple Vitamins-Minerals (MULTIVITAMIN ADULT PO)   Oral   Take by mouth daily.         . nitrofurantoin (MACRODANTIN) 50 MG capsule   Oral   Take 50 mg by mouth 4 (four) times daily. Reported on 02/12/2016         . ranitidine (ZANTAC) 75 MG tablet   Oral   Take 75 mg by mouth as needed.          . rosuvastatin (CRESTOR) 10 MG tablet   Oral   Take 1 tablet (10 mg total) by mouth daily. Patient taking differently: Take 10 mg by mouth every other day.    90 tablet   3   . sucralfate (CARAFATE) 1 g tablet   Oral   Take 1 tablet (1 g total) by mouth 4 (four) times daily.   120 tablet   1   . UNABLE TO FIND      Med Name: nioxin to scalp for thin hair         . URELLE (URELLE/URISED) 81 MG TABS tablet   Oral   Take 1 tablet by mouth. Reported on 02/12/2016         . valACYclovir (VALTREX) 500 MG tablet   Oral   Take 500 mg by mouth 2 (two) times daily. As needed         . VENTOLIN HFA 108 (90 Base) MCG/ACT inhaler   Inhalation   Inhale 2 puffs into the lungs every 4 (four) hours as needed for wheezing or shortness of breath.   1 Inhaler   11     Dispense as written.    Name brand Ventolin only   . VITAMIN D, CHOLECALCIFEROL, PO   Oral   Take 500 mg by mouth daily.         . Wheat Dextrin (BENEFIBER PO)   Oral   Take by mouth daily.  Allergies Midazolam; Nalbuphine; Cefdinir; Iodine; Iohexol; Lipitor; Meperidine hcl; Novocain; Prednisone; and Clarithromycin   Family History  Problem Relation Age of Onset   . Glaucoma Mother   . Heart attack Mother   . Stroke Mother   . Hypertension Other   . Cancer Other     bone, colon  . Diabetes Other     Social History Social History  Substance Use Topics  . Smoking status: Never Smoker   . Smokeless tobacco: Never Used  . Alcohol Use: No    Review of Systems  Constitutional:   No fever or chills.  ENT:   No sore throat. No rhinorrhea. Cardiovascular:   No chest pain. Respiratory:   No dyspnea or cough. Gastrointestinal:   Positive upper abdominal pain without vomiting and diarrhea.  Genitourinary:   Negative for dysuria or difficulty urinating. Musculoskeletal:   Negative for focal pain or swelling Neurological:   Negative for headaches 10-point ROS otherwise negative.  ____________________________________________   PHYSICAL EXAM:  VITAL SIGNS: ED Triage Vitals  Enc Vitals Group     BP 04/27/16 1341 157/66 mmHg     Pulse Rate 04/27/16 1341 55     Resp 04/27/16 1341 18     Temp 04/27/16 1341 97.9 F (36.6 C)     Temp Source 04/27/16 1341 Oral     SpO2 04/27/16 1341 96 %     Weight 04/27/16 1341 147 lb (66.679 kg)     Height --      Head Cir --      Peak Flow --      Pain Score --      Pain Loc --      Pain Edu? --      Excl. in [place]? --     Vital signs reviewed, nursing assessments reviewed.   Constitutional:   Alert and oriented. Well appearing and in no distress. Eyes:   No scleral icterus. No conjunctival pallor. PERRL. EOMI.  No nystagmus. ENT   Head:   Normocephalic and atraumatic.   Nose:   No congestion/rhinnorhea. No septal hematoma   Mouth/Throat:   MMM, no pharyngeal erythema. No peritonsillar mass.    Neck:   No stridor. No SubQ emphysema. No meningismus. Hematological/Lymphatic/Immunilogical:   No cervical lymphadenopathy. Cardiovascular:   RRR. Symmetric bilateral radial and DP pulses.  No murmurs.  Respiratory:   Normal respiratory effort without tachypnea nor retractions. Breath sounds are  clear and equal bilaterally. No wheezes/rales/rhonchi. Gastrointestinal:   Soft and nontender. Non distended. There is no CVA tenderness.  No rebound, rigidity, or guarding. Genitourinary:   deferred Musculoskeletal:   Nontender with normal range of motion in all extremities. No joint effusions.  No lower extremity tenderness.  No edema. Neurologic:   Normal speech and language.  CN 2-10 normal. Motor grossly intact. No gross focal neurologic deficits are appreciated.  Skin:    Skin is warm, dry and intact. No rash noted.  No petechiae, purpura, or bullae.  ____________________________________________    LABS (pertinent positives/negatives) (all labs ordered are listed, but only abnormal results are displayed) Labs Reviewed - No data to display ____________________________________________   EKG    ____________________________________________    RADIOLOGY  X-rays soft tissue neck unremarkable  ____________________________________________   PROCEDURES   ____________________________________________   INITIAL IMPRESSION / ASSESSMENT AND PLAN / ED COURSE  Pertinent labs & imaging results that were available during my care of the patient were reviewed by me and considered in my medical decision making (  see chart for details).  Patient well appearing no acute distress. Presents with upper abdominal pain yesterday that is now resolved. I reviewed the electronic medical record and gastric neurology notes from a year ago. Current symptoms are highly consistent with hiatal hernia that probably have a sliding episode yesterday and then resolved within a few minutes causing resolution of her symptoms. There is also a contribution to this from noncompliance with her PPI therapy. Strongly encouraged her to continue the PPI as well as Zantac right now. Bentyl and Carafate. Schedule follow-up with GI.Considering the patient's symptoms, medical history, and physical examination today, I  have low suspicion for cholecystitis or biliary pathology, pancreatitis, perforation or bowel obstruction, hernia, intra-abdominal abscess, AAA or dissection, volvulus or intussusception, mesenteric ischemia, or appendicitis.  Very low suspicion of ACS TAD PE pneumothorax. No pneumonia or sepsis.  Patient also emphatically states that she will stick to a liquid diet until she sees gastroenterology. I tried related to her that I don't think this is necessary but recognized that she has multiple food sensitivities that she should continue to avoid in the meantime. I did advise her that if she will stick to a liquid diet she should be sure to get something with some dense calorie nutrition like booster in sure. She states that she's drinking Slim fast. Symptoms are entirely consistent with symptoms she was having one year ago, and she needs follow up with GI for endoscopy.     ____________________________________________   FINAL CLINICAL IMPRESSION(S) / ED DIAGNOSES  Final diagnoses:  Hiatal hernia  Gastroesophageal reflux disease with esophagitis       Portions of this note were generated with dragon dictation software. Dictation errors may occur despite best attempts at proofreading.   Carrie Mew, MD 04/27/16 [phone]

## 2016-04-27 NOTE — Discharge Instructions (Signed)
Gastroesophageal Reflux Disease, Adult Normally, food travels down the esophagus and stays in the stomach to be digested. However, when a person has gastroesophageal reflux disease (GERD), food and stomach acid move back up into the esophagus. When this happens, the esophagus becomes sore and inflamed. Over time, GERD can create small holes (ulcers) in the lining of the esophagus.  CAUSES This condition is caused by a problem with the muscle between the esophagus and the stomach (lower esophageal sphincter, or LES). Normally, the LES muscle closes after food passes through the esophagus to the stomach. When the LES is weakened or abnormal, it does not close properly, and that allows food and stomach acid to go back up into the esophagus. The LES can be weakened by certain dietary substances, medicines, and medical conditions, including:  Tobacco use.  Pregnancy.  Having a hiatal hernia.  Heavy alcohol use.  Certain foods and beverages, such as coffee, chocolate, onions, and peppermint. RISK FACTORS This condition is more likely to develop in:  People who have an increased body weight.  People who have connective tissue disorders.  People who use NSAID medicines. SYMPTOMS Symptoms of this condition include:  Heartburn.  Difficult or painful swallowing.  The feeling of having a lump in the throat.  Abitter taste in the mouth.  Bad breath.  Having a large amount of saliva.  Having an upset or bloated stomach.  Belching.  Chest pain.  Shortness of breath or wheezing.  Ongoing (chronic) cough or a night-time cough.  Wearing away of tooth enamel.  Weight loss. Different conditions can cause chest pain. Make sure to see your health care provider if you experience chest pain. DIAGNOSIS Your health care provider will take a medical history and perform a physical exam. To determine if you have mild or severe GERD, your health care provider may also monitor how you respond  to treatment. You may also have other tests, including:  An endoscopy toexamine your stomach and esophagus with a small camera.  A test thatmeasures the acidity level in your esophagus.  A test thatmeasures how much pressure is on your esophagus.  A barium swallow or modified barium swallow to show the shape, size, and functioning of your esophagus. TREATMENT The goal of treatment is to help relieve your symptoms and to prevent complications. Treatment for this condition may vary depending on how severe your symptoms are. Your health care provider may recommend:  Changes to your diet.  Medicine.  Surgery. HOME CARE INSTRUCTIONS Diet  Follow a diet as recommended by your health care provider. This may involve avoiding foods and drinks such as:  Coffee and tea (with or without caffeine).  Drinks that containalcohol.  Energy drinks and sports drinks.  Carbonated drinks or sodas.  Chocolate and cocoa.  Peppermint and mint flavorings.  Garlic and onions.  Horseradish.  Spicy and acidic foods, including peppers, chili powder, curry powder, vinegar, hot sauces, and barbecue sauce.  Citrus fruit juices and citrus fruits, such as oranges, lemons, and limes.  Tomato-based foods, such as red sauce, chili, salsa, and pizza with red sauce.  Fried and fatty foods, such as donuts, french fries, potato chips, and high-fat dressings.  High-fat meats, such as hot dogs and fatty cuts of red and white meats, such as rib eye steak, sausage, ham, and bacon.  High-fat dairy items, such as whole milk, butter, and cream cheese.  Eat small, frequent meals instead of large meals.  Avoid drinking large amounts of liquid with your  meals.  Avoid eating meals during the 2-3 hours before bedtime.  Avoid lying down right after you eat.  Do not exercise right after you eat. General Instructions  Pay attention to any changes in your symptoms.  Take over-the-counter and prescription  medicines only as told by your health care provider. Do not take aspirin, ibuprofen, or other NSAIDs unless your health care provider told you to do so.  Do not use any tobacco products, including cigarettes, chewing tobacco, and e-cigarettes. If you need help quitting, ask your health care provider.  Wear loose-fitting clothing. Do not wear anything tight around your waist that causes pressure on your abdomen.  Raise (elevate) the head of your bed 6 inches (15cm).  Try to reduce your stress, such as with yoga or meditation. If you need help reducing stress, ask your health care provider.  If you are overweight, reduce your weight to an amount that is healthy for you. Ask your health care provider for guidance about a safe weight loss goal.  Keep all follow-up visits as told by your health care provider. This is important. SEEK MEDICAL CARE IF:  You have new symptoms.  You have unexplained weight loss.  You have difficulty swallowing, or it hurts to swallow.  You have wheezing or a persistent cough.  Your symptoms do not improve with treatment.  You have a hoarse voice. SEEK IMMEDIATE MEDICAL CARE IF:  You have pain in your arms, neck, jaw, teeth, or back.  You feel sweaty, dizzy, or light-headed.  You have chest pain or shortness of breath.  You vomit and your vomit looks like blood or coffee grounds.  You faint.  Your stool is bloody or black.  You cannot swallow, drink, or eat.   This information is not intended to replace advice given to you by your health care provider. Make sure you discuss any questions you have with your health care provider.   Document Released: 07/16/2005 Document Revised: 06/27/2015 Document Reviewed: 01/31/2015 Elsevier Interactive Patient Education 2016 [employer]  Hiatal Hernia A hiatal hernia occurs when part of your stomach slides above the muscle that separates your abdomen from your chest (diaphragm). You can be born with a hiatal  hernia (congenital), or it may develop over time. In almost all cases of hiatal hernia, only the top part of the stomach pushes through.  Many people have a hiatal hernia with no symptoms. The larger the hernia, the more likely that you will have symptoms. In some cases, a hiatal hernia allows stomach acid to flow back into the tube that carries food from your mouth to your stomach (esophagus). This may cause heartburn symptoms. Severe heartburn symptoms may mean you have developed a condition called gastroesophageal reflux disease (GERD).  CAUSES  Hiatal hernias are caused by a weakness in the opening (hiatus) where your esophagus passes through your diaphragm to attach to the upper part of your stomach. You may be born with a weakness in your hiatus, or a weakness can develop. RISK FACTORS Older age is a major risk factor for a hiatal hernia. Anything that increases pressure on your diaphragm can also increase your risk of a hiatal hernia. This includes:  Pregnancy.  Excess weight.  Frequent constipation. SIGNS AND SYMPTOMS  People with a hiatal hernia often have no symptoms. If symptoms develop, they are almost always caused by GERD. They may include:  Heartburn.  Belching.  Indigestion.  Trouble swallowing.  Coughing or wheezing.  Sore throat.  Hoarseness.  Chest pain. DIAGNOSIS  A hiatal hernia is sometimes found during an exam for another problem. Your health care provider may suspect a hiatal hernia if you have symptoms of GERD. Tests may be done to diagnose GERD. These may include:  X-rays of your stomach or chest.  An upper gastrointestinal (GI) series. This is an X-ray exam of your GI tract involving the use of a chalky liquid that you swallow. The liquid shows up clearly on the X-ray.  Endoscopy. This is a procedure to look into your stomach using a thin, flexible tube that has a tiny camera and light on the end of it. TREATMENT  If you have no symptoms, you may not  need treatment. If you have symptoms, treatment may include:  Dietary and lifestyle changes to help reduce GERD symptoms.  Medicines. These may include:  Over-the-counter antacids.  Medicines that make your stomach empty more quickly.  Medicines that block the production of stomach acid (H2 blockers).  Stronger medicines to reduce stomach acid (proton pump inhibitors).  You may need surgery to repair the hernia if other treatments are not helping. HOME CARE INSTRUCTIONS   Take all medicines as directed by your health care provider.  Quit smoking, if you smoke.  Try to achieve and maintain a healthy body weight.  Eat frequent small meals instead of three large meals a day. This keeps your stomach from getting too full.  Eat slowly.  Do not lie down right after eating.  Do noteat 1-2 hours before bed.   Do not drink beverages with caffeine. These include cola, coffee, cocoa, and tea.  Do not drink alcohol.  Avoid foods that can make symptoms of GERD worse. These may include:  Fatty foods.  Citrus fruits.  Other foods and drinks that contain acid.  Avoid putting pressure on your belly. Anything that puts pressure on your belly increases the amount of acid that may be pushed up into your esophagus.   Avoid bending over, especially after eating.  Raise the head of your bed by putting blocks under the legs. This keeps your head and esophagus higher than your stomach.  Do not wear tight clothing around your chest or stomach.  Try not to strain when having a bowel movement, when urinating, or when lifting heavy objects. SEEK MEDICAL CARE IF:  Your symptoms are not controlled with medicines or lifestyle changes.  You are having trouble swallowing.  You have coughing or wheezing that will not go away. SEEK IMMEDIATE MEDICAL CARE IF:  Your pain is getting worse.  Your pain spreads to your arms, neck, jaw, teeth, or back.  You have shortness of breath.  You  sweat for no reason.  You feel sick to your stomach (nauseous) or vomit.  You vomit blood.  You have bright red blood in your stools.  You have black, tarry stools.    This information is not intended to replace advice given to you by your health care provider. Make sure you discuss any questions you have with your health care provider.   Document Released: 12/27/2003 Document Revised: 10/27/2014 Document Reviewed: 09/23/2013 Elsevier Interactive Patient Education [employer].

## 2016-04-27 NOTE — ED Notes (Signed)
Pt arrives to ER via POV c/o choking episode that occurred yesterday. Pt was seen in ER evaluated yesterday and left before Ultrasound performed because she had to pick up her grandkids. Pt back to get her ultrasound today. Pt has not eating since choking episode yesterday. Pt alert and oriented X4, active, cooperative, pt in NAD. RR even and unlabored, color WNL.

## 2016-05-12 ENCOUNTER — Ambulatory Visit: Payer: BC Managed Care – PPO | Attending: Diagnostic Neuroimaging | Admitting: Physical Therapy

## 2016-05-12 ENCOUNTER — Encounter: Payer: Self-pay | Admitting: Physical Therapy

## 2016-05-12 DIAGNOSIS — M5440 Lumbago with sciatica, unspecified side: Secondary | ICD-10-CM | POA: Insufficient documentation

## 2016-05-12 NOTE — Therapy (Signed)
[place] MAIN [facility] SERVICES [address] [place], [place], [address] Phone: [phone]   Fax:  [phone]  Physical Therapy Evaluation  Patient Details  Name: Shelley Jones MRN: [id number] Date of Birth: 25-Feb-1951 Referring Provider: Penni Bombard   Encounter Date: 05/12/2016      PT End of Session - 05/12/16 1003    Visit Number 1   Number of Visits 17   Date for PT Re-Evaluation 07/07/16   PT Start Time 0945   PT Stop Time 1045   PT Time Calculation (min) 60 min   Activity Tolerance Patient limited by pain      Past Medical History:  Diagnosis Date  . Allergic rhinitis   . Anxiety   . Asthma   . Colitis   . Diverticulitis    sees Dr. Deatra Ina  . DVT (deep venous thrombosis) ([place])   . Fibromyalgia   . Gynecological examination    sees Dr. Corinna Capra  . Hyperlipidemia   . IBS (irritable bowel syndrome)   . Interstitial cystitis    sees Dr. Ellard Artis   . Proctitis   . Restless leg   . Shingles   . Uterine fibroids affecting pregnancy     Past Surgical History:  Procedure Laterality Date  . CHOLECYSTECTOMY    . COLONOSCOPY     all attempts at a colonoscopy (last in 1995) were unsuccessful due to a very tortuous colon  . TONSILLECTOMY    . UTERINE FIBROID SURGERY  08/2015    There were no vitals filed for this visit.       Subjective Assessment - 05/12/16 0958    Subjective Patient is having low back pain for 4 or 5 years .    Limitations Standing;Walking;Sitting   How long can you sit comfortably? hours   How long can you stand comfortably? 30 mins   How long can you walk comfortably? 20 mins   Diagnostic tests x ray, MRI,    Patient Stated Goals Patient wants this pain to stop    Currently in Pain? Yes   Pain Score 2    Pain Location Back   Pain Orientation Left   Pain Descriptors / Indicators Aching   Pain Type Chronic pain   Pain Radiating Towards , constant buring and tingling in right foot    Aggravating Factors  bending, lifting   Pain Relieving Factors nothing   Effect of Pain on Daily Activities unable to work            [facility] PT Assessment - 05/12/16 0001      Assessment   Medical Diagnosis low back pain   Referring Provider Andrey Spearman R    Onset Date/Surgical Date 03/06/16   Hand Dominance Right   Next MD Visit November 1.17   Prior Therapy no     Precautions   Precautions Fall     Restrictions   Weight Bearing Restrictions No     Balance Screen   Has the patient fallen in the past 6 months Yes   Has the patient had a decrease in activity level because of a fear of falling?  Yes   Is the patient reluctant to leave their home because of a fear of falling?  No     Home Environment   Living Environment Private residence   Available Help at Discharge Family   Type of [place] to enter   Entrance Stairs-Number of Steps  3   Entrance Stairs-Rails Right   Home Layout Two level   Alternate Level Stairs-Number of Steps 12   Alternate Level Stairs-Rails Right   Home Equipment None     Prior Function   Level of Independence Independent   Vocation Part time employment   Contractor   Leisure ee grandchildren , go to the park , swing sets        PAIN: 210 low back mid and left side  / POSTURE: WFL   PROM/AROM:  STRENGTH:  Graded on a 0-5 scale Muscle Group Left Right  Shoulder flex    Shoulder Abd    Shoulder Ext    Shoulder IR/ER    Elbow    Wrist/hand    Hip Flex 4/5 3+/5  Hip Abd 3+/5 4/5  Hip Add 2/5 2/5  Hip Ext 2/5 2/5  Hip IR/ER    Knee Flex 5/5 5/5  Knee Ext 5/5 5/5  Ankle DF 5/5 5/5  Ankle PF 5/5 5/5   SENSATION:  WNL, intermittent RLE burning and tingling to right foot   SPECIAL TESTS: + prone flex LLE   FUNCTIONAL MOBILITY: guarded mobility  GAIT: ambulation without AD antalgic   OUTCOME MEASURES: TEST Outcome Interpretation  5 times sit<>stand 16.77sec >32 yo, >15  sec indicates increased risk for falls  10 meter walk test                 1.40m/s <1.0 m/s indicates increased risk for falls; limited community ambulator      6 minute walk test   1000             Feet 1000 feet is community ambulator                                   PT Long Term Goals - 05/12/16 1115      PT LONG TERM GOAL #1   Title  Patient will be independent in bending down towards floor and picking up small object (<5 pounds) and then stand back up without pain as to improve ability to pick up and clean up room at home   Baseline unable   Time 8   Period Weeks   Status New     PT LONG TERM GOAL #2   Title Patient will increase BLE gross strength to 4+/5 as to improve functional strength for independent gait, increased standing tolerance and increased ADL ability.   Baseline 2/5-3/5-4/5 BLE hips   Time 8   Period Weeks   Status New     PT LONG TERM GOAL #3   Title Patient will report a worst pain of 1/10 on VAS in    low back         to improve tolerance with ADLs and reduced symptoms with activities.    Time 8   Period Weeks   Status New               Plan - 05/12/16 1106    Clinical Impression Statement Patient presents with recent history of back spasms and decreased mobiity. She has 2/10 back pain today with guarded movement during bed mobility. She has + prone LLE flex test , + spring test to L 1- L 5 , and hs tenderness to palpation to left sided paraspinal musculature.She has intermittent RLE foot burning pain and L hip/ buttocks pain. She has weakness in BLE  hips and will benefit from skilled PT to imrprove mobility and decrease her pain.    Rehab Potential Good   Clinical Impairments Affecting Rehab Potential pain, weakness, impaired sensation R foot   PT Frequency 2x / week   PT Duration 8 weeks   PT Treatment/Interventions Cryotherapy;Moist Heat;Traction;Electrical Stimulation;Functional mobility training;Therapeutic  activities;Therapeutic exercise;Manual techniques;Patient/family education   PT Next Visit Plan Manual  therapy, progress HEp   PT Home Exercise Plan Prone press ups every hour   Consulted and Agree with Plan of Care Patient      Patient will benefit from skilled therapeutic intervention in order to improve the following deficits and impairments:  Abnormal gait, Decreased mobility, Decreased activity tolerance, Decreased strength, Impaired flexibility, Pain, Impaired sensation  Visit Diagnosis: Midline low back pain with sciatica, sciatica laterality unspecified      G-Codes - 31-May-2016 1025    Functional Assessment Tool Used 10 MW, 6 MW , 5 x sit to stand   Functional Limitation Mobility: Walking and moving around   Mobility: Walking and Moving Around Current Status [phone]) At least 20 percent but less than 40 percent impaired, limited or restricted   Mobility: Walking and Moving Around Goal Status [phone]) At least 1 percent but less than 20 percent impaired, limited or restricted       Problem List Patient Active Problem List   Diagnosis Date Noted  . Insomnia w/ sleep apnea 01/30/2016  . Snoring 01/30/2016  . Abdominal pain, chronic, epigastric 01/12/2015  . Dysphagia, pharyngoesophageal phase 01/12/2015  . Special screening for malignant neoplasms, colon 01/12/2015  . GERD (gastroesophageal reflux disease) 01/09/2015  . Shingles 2014-05-31  . ACUTE BRONCHITIS 02/05/2010  . DIVERTICULITIS OF COLON 01/28/2010  . ACUTE SINUSITIS, UNSPECIFIED 11/21/2009  . INFLUENZA 11/21/2009  . ANXIETY 05/05/2008  . RESTLESS LEG SYNDROME 05/05/2008  . DIVERTICULITIS, HX OF 05/05/2008  . FIBROIDS, UTERUS 02/21/2008  . HYPERLIPIDEMIA 02/21/2008  . GENERALIZED ANXIETY DISORDER 02/21/2008  . ALLERGIC RHINITIS 02/21/2008  . ASTHMA 02/21/2008  . IBS 02/21/2008  . EPISTAXIS 02/21/2008  . DVT, HX OF 02/21/2008   Alanson Puls, PT, DPT [place], [place] S 2016/05/31, 11:22  AM  [place] MAIN [facility] SERVICES [address] [place], [place], [address] Phone: [phone]   Fax:  [phone]  Name: Shelley Jones MRN: [id number] Date of Birth: [birth year]

## 2016-05-14 ENCOUNTER — Ambulatory Visit: Payer: BC Managed Care – PPO | Admitting: Physical Therapy

## 2016-05-14 ENCOUNTER — Encounter: Payer: Self-pay | Admitting: Physical Therapy

## 2016-05-14 DIAGNOSIS — M5440 Lumbago with sciatica, unspecified side: Secondary | ICD-10-CM | POA: Diagnosis not present

## 2016-05-14 NOTE — Therapy (Signed)
[facility] SERVICES [address] [place], [place], [address] Phone: [phone]   Fax:  [phone]  Physical Therapy Treatment  Patient Details  Name: Shelley Jones MRN: [id number] Date of Birth: [birth year] Referring Provider: Penni Bombard   Encounter Date: 05/14/2016      PT End of Session - 05/14/16 1010    Visit Number (P)  2   Number of Visits (P)  17   Date for PT Re-Evaluation (P)  07/07/16   PT Start Time (P)  1000   PT Stop Time (P)  1045   PT Time Calculation (min) (P)  45 min   Activity Tolerance (P)  Patient limited by pain   Behavior During Therapy (P)  WFL for tasks assessed/performed      Past Medical History:  Diagnosis Date  . Allergic rhinitis   . Anxiety   . Asthma   . Colitis   . Diverticulitis    sees Dr. Deatra Ina  . DVT (deep venous thrombosis) ([place])   . Fibromyalgia   . Gynecological examination    sees Dr. Corinna Capra  . Hyperlipidemia   . IBS (irritable bowel syndrome)   . Interstitial cystitis    sees Dr. Ellard Artis   . Proctitis   . Restless leg   . Shingles   . Uterine fibroids affecting pregnancy     Past Surgical History:  Procedure Laterality Date  . CHOLECYSTECTOMY    . COLONOSCOPY     all attempts at a colonoscopy (last in 1995) were unsuccessful due to a very tortuous colon  . TONSILLECTOMY    . UTERINE FIBROID SURGERY  08/2015    There were no vitals filed for this visit.      Subjective Assessment - 05/14/16 1009    Subjective Patient is having low back pain for 4 or 5 years . She is having low back pain.    Limitations Standing;Walking;Sitting   How long can you sit comfortably? hours   How long can you stand comfortably? 30 mins   How long can you walk comfortably? 20 mins   Diagnostic tests x ray, MRI,    Patient Stated Goals Patient wants this pain to stop    Currently in Pain? Yes   Pain Score 2    Pain Location Back   Pain Orientation Left   Pain Descriptors  / Indicators Aching       Manual therapy:  PA glides,  grade 3 to   T 8- T 12 , L1- L5, Sacrum x 30 bouts x 3 STM to left sided paraspinal muscles   E-stim to low back IFC crossed pattern x 20 mins and MH  Patient has relief following therapy and has increased walking speeds and better sit to stand with decreased spasms.                           PT Education - 05/14/16 1010    Education provided Yes   Education Details HEP review   Person(s) Educated Patient   Methods Explanation   Comprehension Verbalized understanding             PT Long Term Goals - 05/12/16 1115      PT LONG TERM GOAL #1   Title  Patient will be independent in bending down towards floor and picking up small object (<5 pounds) and then stand back up without pain as to improve ability to  pick up and clean up room at home   Baseline unable   Time 8   Period Weeks   Status New     PT LONG TERM GOAL #2   Title Patient will increase BLE gross strength to 4+/5 as to improve functional strength for independent gait, increased standing tolerance and increased ADL ability.   Baseline 2/5-3/5-4/5 BLE hips   Time 8   Period Weeks   Status New     PT LONG TERM GOAL #3   Title Patient will report a worst pain of 1/10 on VAS in    low back         to improve tolerance with ADLs and reduced symptoms with activities.    Time 8   Period Weeks   Status New               Plan - 05/14/16 1042    Clinical Impression Statement Patient presents today very stiff and difficulty moving. She responded well to manual therapy to lumbar spine followed by e-stim and MH.    Rehab Potential Good   Clinical Impairments Affecting Rehab Potential pain, weakness, impaired sensation R foot   PT Frequency 2x / week   PT Duration 8 weeks   PT Treatment/Interventions Cryotherapy;Moist Heat;Traction;Electrical Stimulation;Functional mobility training;Therapeutic activities;Therapeutic exercise;Manual  techniques;Patient/family education   PT Next Visit Plan Manual  therapy, progress HEp   PT Home Exercise Plan Prone press ups every hour   Consulted and Agree with Plan of Care Patient      Patient will benefit from skilled therapeutic intervention in order to improve the following deficits and impairments:  Abnormal gait, Decreased mobility, Decreased activity tolerance, Decreased strength, Impaired flexibility, Pain, Impaired sensation  Visit Diagnosis: Midline low back pain with sciatica, sciatica laterality unspecified     Problem List Patient Active Problem List   Diagnosis Date Noted  . Insomnia w/ sleep apnea 01/30/2016  . Snoring 01/30/2016  . Abdominal pain, chronic, epigastric 01/12/2015  . Dysphagia, pharyngoesophageal phase 01/12/2015  . Special screening for malignant neoplasms, colon 01/12/2015  . GERD (gastroesophageal reflux disease) 01/09/2015  . Shingles 05/12/2014  . ACUTE BRONCHITIS 02/05/2010  . DIVERTICULITIS OF COLON 01/28/2010  . ACUTE SINUSITIS, UNSPECIFIED 11/21/2009  . INFLUENZA 11/21/2009  . ANXIETY 05/05/2008  . RESTLESS LEG SYNDROME 05/05/2008  . DIVERTICULITIS, HX OF 05/05/2008  . FIBROIDS, UTERUS 02/21/2008  . HYPERLIPIDEMIA 02/21/2008  . GENERALIZED ANXIETY DISORDER 02/21/2008  . ALLERGIC RHINITIS 02/21/2008  . ASTHMA 02/21/2008  . IBS 02/21/2008  . EPISTAXIS 02/21/2008  . DVT, HX OF 02/21/2008   Alanson Puls, PT, DPT [place] S 05/14/2016, 10:47 AM  [place] [facility] SERVICES [address] [place], [place], [address] Phone: [phone]   Fax:  [phone]  Name: Shelley Jones MRN: [id number] Date of Birth: 1951/07/19

## 2016-05-19 ENCOUNTER — Ambulatory Visit: Payer: BC Managed Care – PPO | Admitting: Physical Therapy

## 2016-05-19 ENCOUNTER — Encounter: Payer: Self-pay | Admitting: Physical Therapy

## 2016-05-19 DIAGNOSIS — M5440 Lumbago with sciatica, unspecified side: Secondary | ICD-10-CM

## 2016-05-19 NOTE — Therapy (Signed)
[place] MAIN [facility] SERVICES [address] [place], [place], [address] Phone: [phone]   Fax:  [phone]  Physical Therapy Treatment  Patient Details  Name: Shelley Jones MRN: [id number] Date of Birth: 23-Mar-1951 Referring Provider: Penni Bombard   Encounter Date: 05/19/2016      PT End of Session - 05/19/16 1126    Visit Number 3   Number of Visits 17   Date for PT Re-Evaluation 07/07/16   PT Start Time [id number]   PT Stop Time 1141   PT Time Calculation (min) 49 min   Activity Tolerance Patient limited by pain   Behavior During Therapy [facility] for tasks assessed/performed      Past Medical History:  Diagnosis Date  . Allergic rhinitis   . Anxiety   . Asthma   . Colitis   . Diverticulitis    sees Dr. Deatra Ina  . DVT (deep venous thrombosis) ([place])   . Fibromyalgia   . Gynecological examination    sees Dr. Corinna Capra  . Hyperlipidemia   . IBS (irritable bowel syndrome)   . Interstitial cystitis    sees Dr. Ellard Artis   . Proctitis   . Restless leg   . Shingles   . Uterine fibroids affecting pregnancy     Past Surgical History:  Procedure Laterality Date  . CHOLECYSTECTOMY    . COLONOSCOPY     all attempts at a colonoscopy (last in 1995) were unsuccessful due to a very tortuous colon  . TONSILLECTOMY    . UTERINE FIBROID SURGERY  08/2015    There were no vitals filed for this visit.      Subjective Assessment - 05/19/16 1124    Subjective Pt reports she is doing well. Her back pain continues to vary with intensity.    Limitations Standing;Walking;Sitting   How long can you sit comfortably? hours   How long can you stand comfortably? 30 mins   How long can you walk comfortably? 20 mins   Diagnostic tests x ray, MRI,    Patient Stated Goals Patient wants this pain to stop    Currently in Pain? Yes   Pain Score 4   left lower back      Manual therapy:   PA mobilizations, grade 3 to T 10- T 12 , L1- L5 3x30  each STM and trigger point release to left sided paraspinal muscles   Pt's tolerance monitored frequently during manual treatment.  Therex:  PPU 3x10 POE x2 minutes  Min cues for proper technique of exercise for most effective stretching and increased ROM.   E-stim (attended):   E-stim to low back IFC crossed pattern x 20 mins and MH  Pt monitored throughout for tolerance and adjustment of intensity of e-stim and MH.  Pt reported reduced back pain and muscle tightness following treatment.                           PT Education - 05/19/16 1125    Education provided Yes   Education Details continue HEP, heat as needed at home for reduced muscle spasms   Person(s) Educated Patient   Methods Explanation   Comprehension Verbalized understanding             PT Long Term Goals - 05/12/16 1115      PT LONG TERM GOAL #1   Title  Patient will be independent in bending down towards floor and picking up small  object (<5 pounds) and then stand back up without pain as to improve ability to pick up and clean up room at home   Baseline unable   Time 8   Period Weeks   Status New     PT LONG TERM GOAL #2   Title Patient will increase BLE gross strength to 4+/5 as to improve functional strength for independent gait, increased standing tolerance and increased ADL ability.   Baseline 2/5-3/5-4/5 BLE hips   Time 8   Period Weeks   Status New     PT LONG TERM GOAL #3   Title Patient will report a worst pain of 1/10 on VAS in    low back         to improve tolerance with ADLs and reduced symptoms with activities.    Time 8   Period Weeks   Status New               Plan - 05/19/16 1126    Clinical Impression Statement Pt continues to be stiff in thoracic and lumbar spine. Following manual treatment and e-stim pt reported decreased back pain. She will benefit from continued skilled therapy to further progress towards functional goals.   Rehab Potential  Good   Clinical Impairments Affecting Rehab Potential pain, weakness, impaired sensation R foot   PT Frequency 2x / week   PT Duration 8 weeks   PT Treatment/Interventions Cryotherapy;Moist Heat;Traction;Electrical Stimulation;Functional mobility training;Therapeutic activities;Therapeutic exercise;Manual techniques;Patient/family education   PT Next Visit Plan Manual  therapy, progress HEp   PT Home Exercise Plan Prone press ups every hour   Consulted and Agree with Plan of Care Patient      Patient will benefit from skilled therapeutic intervention in order to improve the following deficits and impairments:  Abnormal gait, Decreased mobility, Decreased activity tolerance, Decreased strength, Impaired flexibility, Pain, Impaired sensation  Visit Diagnosis: Midline low back pain with sciatica, sciatica laterality unspecified     Problem List Patient Active Problem List   Diagnosis Date Noted  . Insomnia w/ sleep apnea 01/30/2016  . Snoring 01/30/2016  . Abdominal pain, chronic, epigastric 01/12/2015  . Dysphagia, pharyngoesophageal phase 01/12/2015  . Special screening for malignant neoplasms, colon 01/12/2015  . GERD (gastroesophageal reflux disease) 01/09/2015  . Shingles 05/12/2014  . ACUTE BRONCHITIS 02/05/2010  . DIVERTICULITIS OF COLON 01/28/2010  . ACUTE SINUSITIS, UNSPECIFIED 11/21/2009  . INFLUENZA 11/21/2009  . ANXIETY 05/05/2008  . RESTLESS LEG SYNDROME 05/05/2008  . DIVERTICULITIS, HX OF 05/05/2008  . FIBROIDS, UTERUS 02/21/2008  . HYPERLIPIDEMIA 02/21/2008  . GENERALIZED ANXIETY DISORDER 02/21/2008  . ALLERGIC RHINITIS 02/21/2008  . ASTHMA 02/21/2008  . IBS 02/21/2008  . EPISTAXIS 02/21/2008  . DVT, HX OF 02/21/2008   Neoma Laming, PT, DPT  05/19/16, 11:50 AM [phone]  [place] MAIN [facility] SERVICES [address] [place], [place], [address] Phone: [phone]   Fax:  [phone]  Name: DONECIA DAKER MRN: [id number] Date of Birth: 1951/03/31

## 2016-05-21 ENCOUNTER — Ambulatory Visit: Payer: BC Managed Care – PPO | Attending: Diagnostic Neuroimaging | Admitting: Physical Therapy

## 2016-05-21 ENCOUNTER — Encounter: Payer: Self-pay | Admitting: Physical Therapy

## 2016-05-21 DIAGNOSIS — M5440 Lumbago with sciatica, unspecified side: Secondary | ICD-10-CM | POA: Insufficient documentation

## 2016-05-21 NOTE — Therapy (Signed)
[place] MAIN [facility] SERVICES [address] [place], [place], [address] Phone: [phone]   Fax:  [phone]  Physical Therapy Treatment  Patient Details  Name: Shelley Jones MRN: [id number] Date of Birth: 11-01-1950 Referring Provider: Penni Bombard   Encounter Date: 05/21/2016      PT End of Session - 05/21/16 1113    Visit Number 4   Number of Visits 17   Date for PT Re-Evaluation 07/07/16   PT Start Time [id number]   PT Stop Time 1127   PT Time Calculation (min) 42 min   Activity Tolerance Patient limited by pain   Behavior During Therapy [facility] for tasks assessed/performed      Past Medical History:  Diagnosis Date  . Allergic rhinitis   . Anxiety   . Asthma   . Colitis   . Diverticulitis    sees Dr. Deatra Ina  . DVT (deep venous thrombosis) ([place])   . Fibromyalgia   . Gynecological examination    sees Dr. Corinna Capra  . Hyperlipidemia   . IBS (irritable bowel syndrome)   . Interstitial cystitis    sees Dr. Ellard Artis   . Proctitis   . Restless leg   . Shingles   . Uterine fibroids affecting pregnancy     Past Surgical History:  Procedure Laterality Date  . CHOLECYSTECTOMY    . COLONOSCOPY     all attempts at a colonoscopy (last in 1995) were unsuccessful due to a very tortuous colon  . TONSILLECTOMY    . UTERINE FIBROID SURGERY  08/2015    There were no vitals filed for this visit.      Subjective Assessment - 05/21/16 1109    Subjective Pt reports she continues to have intermittent back pain. Today in the lower thoracic spine. She reports she cleaned her house and her back pain was flared up, likely by improper mechanics.   Limitations Standing;Walking;Sitting   How long can you sit comfortably? hours   How long can you stand comfortably? 30 mins   How long can you walk comfortably? 20 mins   Diagnostic tests x ray, MRI,    Patient Stated Goals Patient wants this pain to stop    Currently in Pain? Yes   Pain Score 3    mid to low thoracic spine      Manual therapy:    PA mobilizations, grade II to T 16- T 12 ,  Increased muscle tightness on R>L paraspinals. STM and trigger point release to B paraspinal muscles. Pt reported reduced pain after manual treatment.    Pt's tolerance monitored frequently during manual treatment.   Therex:  Thoracic extensions with towel roll at T8, 9, 10 in chair 2x10 each, tenderness and stiffness at end range. Pt instructed to not push into pain.   Theract:  Provided, reviewed and practiced ways of proper body and lifting mechanics.  Including avoiding twisting, repetitive forwards bending via rounding the back, etc. Practiced lifting, vacuuming and reaching for objects with proper mechanics.                           PT Education - 05/21/16 1112    Education provided Yes   Education Details body mechanics, lifting mechanics   Person(s) Educated Patient   Methods Explanation;Demonstration;Handout   Comprehension Verbalized understanding;Returned demonstration             PT Long Term Goals - 05/12/16 1115  PT LONG TERM GOAL #1   Title  Patient will be independent in bending down towards floor and picking up small object (<5 pounds) and then stand back up without pain as to improve ability to pick up and clean up room at home   Baseline unable   Time 8   Period Weeks   Status New     PT LONG TERM GOAL #2   Title Patient will increase BLE gross strength to 4+/5 as to improve functional strength for independent gait, increased standing tolerance and increased ADL ability.   Baseline 2/5-3/5-4/5 BLE hips   Time 8   Period Weeks   Status New     PT LONG TERM GOAL #3   Title Patient will report a worst pain of 1/10 on VAS in    low back         to improve tolerance with ADLs and reduced symptoms with activities.    Time 8   Period Weeks   Status New               Plan - 05/21/16 1127    Clinical Impression  Statement Pt's lumbar spine is improving. However, pt's mid and lower thoracic spine is now the main painful and stiff area. Pt reported she was doing many improper body mechanics and was pleased to learn new ways to protect her spine. After stretching exercises and manual treatment pt reported a significant decrease in thoracic pain and tightness. She will benefit from continued stretching, manual treatment and core/LE strengthening to progress towards functional goals.   Rehab Potential Good   Clinical Impairments Affecting Rehab Potential pain, weakness, impaired sensation R foot   PT Frequency 2x / week   PT Duration 8 weeks   PT Treatment/Interventions Cryotherapy;Moist Heat;Traction;Electrical Stimulation;Functional mobility training;Therapeutic activities;Therapeutic exercise;Manual techniques;Patient/family education   PT Next Visit Plan Manual  therapy, progress HEp   PT Home Exercise Plan Prone press ups every hour   Consulted and Agree with Plan of Care Patient      Patient will benefit from skilled therapeutic intervention in order to improve the following deficits and impairments:  Abnormal gait, Decreased mobility, Decreased activity tolerance, Decreased strength, Impaired flexibility, Pain, Impaired sensation  Visit Diagnosis: Midline low back pain with sciatica, sciatica laterality unspecified     Problem List Patient Active Problem List   Diagnosis Date Noted  . Insomnia w/ sleep apnea 01/30/2016  . Snoring 01/30/2016  . Abdominal pain, chronic, epigastric 01/12/2015  . Dysphagia, pharyngoesophageal phase 01/12/2015  . Special screening for malignant neoplasms, colon 01/12/2015  . GERD (gastroesophageal reflux disease) 01/09/2015  . Shingles 05/12/2014  . ACUTE BRONCHITIS 02/05/2010  . DIVERTICULITIS OF COLON 01/28/2010  . ACUTE SINUSITIS, UNSPECIFIED 11/21/2009  . INFLUENZA 11/21/2009  . ANXIETY 05/05/2008  . RESTLESS LEG SYNDROME 05/05/2008  . DIVERTICULITIS, HX  OF 05/05/2008  . FIBROIDS, UTERUS 02/21/2008  . HYPERLIPIDEMIA 02/21/2008  . GENERALIZED ANXIETY DISORDER 02/21/2008  . ALLERGIC RHINITIS 02/21/2008  . ASTHMA 02/21/2008  . IBS 02/21/2008  . EPISTAXIS 02/21/2008  . DVT, HX OF 02/21/2008    Neoma Laming, PT, DPT  05/21/16, 3:31 PM [place] MAIN [facility] SERVICES [address] [place], [place], [address] Phone: [phone]   Fax:  [phone]  Name: Shelley Jones MRN: [id number] Date of Birth: 1951/09/21

## 2016-05-21 NOTE — Patient Instructions (Signed)
Provided, reviewed and practiced handout for proper body mechanics and lifting techniques. [email or web address]

## 2016-05-26 ENCOUNTER — Ambulatory Visit: Payer: BC Managed Care – PPO | Admitting: Physical Therapy

## 2016-05-26 ENCOUNTER — Telehealth: Payer: Self-pay | Admitting: Diagnostic Neuroimaging

## 2016-05-26 NOTE — Telephone Encounter (Signed)
Pt called in and would like to be referred to a different PT company. She does not want to [facility]. Any where else is fine. [phone]

## 2016-05-26 NOTE — Telephone Encounter (Signed)
Called and spoke to Patient she wants to Go to [facility] . Hilda Blades will fax order telephone [phone] fax [phone]. I have called [place] and spoke to [facility]  [id number]  and relayed patient would not be coming back.

## 2016-05-26 NOTE — Therapy (Signed)
[place] MAIN [facility] SERVICES [address] [place], [place], [address] Phone: [phone]   Fax:  [phone] Patient decided to cancel her appointment after having a delay time to be brought back to the therapy clinic due to a mix up with her schedule.   Patient Details  Name: Shelley Jones MRN: [id number] Date of Birth: 1951/09/27 Referring Provider:  Penni Bombard, MD Alanson Puls, PT, DPT Encounter Date: 05/26/2016 Alanson Puls 05/26/2016, 10:54 AM  [place] [address] [place], [place], [address] Phone: [phone]   Fax:  [phone]

## 2016-05-28 ENCOUNTER — Ambulatory Visit: Payer: BC Managed Care – PPO | Admitting: Physical Therapy

## 2016-06-02 ENCOUNTER — Encounter: Payer: BC Managed Care – PPO | Admitting: Physical Therapy

## 2016-06-04 ENCOUNTER — Encounter: Payer: BC Managed Care – PPO | Admitting: Physical Therapy

## 2016-06-09 ENCOUNTER — Encounter: Payer: BC Managed Care – PPO | Admitting: Physical Therapy

## 2016-06-11 ENCOUNTER — Ambulatory Visit (INDEPENDENT_AMBULATORY_CARE_PROVIDER_SITE_OTHER): Payer: BC Managed Care – PPO | Admitting: Family Medicine

## 2016-06-11 ENCOUNTER — Encounter: Payer: BC Managed Care – PPO | Admitting: Physical Therapy

## 2016-06-11 VITALS — BP 120/80 | HR 79 | Temp 98.5°F | Ht 61.0 in | Wt 144.0 lb

## 2016-06-11 DIAGNOSIS — K219 Gastro-esophageal reflux disease without esophagitis: Secondary | ICD-10-CM

## 2016-06-11 DIAGNOSIS — K222 Esophageal obstruction: Secondary | ICD-10-CM | POA: Insufficient documentation

## 2016-06-11 DIAGNOSIS — Q394 Esophageal web: Secondary | ICD-10-CM | POA: Diagnosis not present

## 2016-06-11 NOTE — Progress Notes (Signed)
Pre visit review using our clinic review tool, if applicable. No additional management support is needed unless otherwise documented below in the visit note. 

## 2016-06-11 NOTE — Patient Instructions (Signed)
Food Choices for Gastroesophageal Reflux Disease, Adult When you have gastroesophageal reflux disease (GERD), the foods you eat and your eating habits are very important. Choosing the right foods can help ease the discomfort of GERD. WHAT GENERAL GUIDELINES DO I NEED TO FOLLOW?  Choose fruits, vegetables, whole grains, low-fat dairy products, and low-fat meat, fish, and poultry.  Limit fats such as oils, salad dressings, butter, nuts, and avocado.  Keep a food diary to identify foods that cause symptoms.  Avoid foods that cause reflux. These may be different for different people.  Eat frequent small meals instead of three large meals each day.  Eat your meals slowly, in a relaxed setting.  Limit fried foods.  Cook foods using methods other than frying.  Avoid drinking alcohol.  Avoid drinking large amounts of liquids with your meals.  Avoid bending over or lying down until 2-3 hours after eating. WHAT FOODS ARE NOT RECOMMENDED? The following are some foods and drinks that may worsen your symptoms: Vegetables Tomatoes. Tomato juice. Tomato and spaghetti sauce. Chili peppers. Onion and garlic. Horseradish. Fruits Oranges, grapefruit, and lemon (fruit and juice). Meats High-fat meats, fish, and poultry. This includes hot dogs, ribs, ham, sausage, salami, and bacon. Dairy Whole milk and chocolate milk. Sour cream. Cream. Butter. Ice cream. Cream cheese.  Beverages Coffee and tea, with or without caffeine. Carbonated beverages or energy drinks. Condiments Hot sauce. Barbecue sauce.  Sweets/Desserts Chocolate and cocoa. Donuts. Peppermint and spearmint. Fats and Oils High-fat foods, including [place] fries and potato chips. Other Vinegar. Strong spices, such as black pepper, white pepper, red pepper, cayenne, curry powder, cloves, ginger, and chili powder. The items listed above may not be a complete list of foods and beverages to avoid. Contact your dietitian for more  information.   This information is not intended to replace advice given to you by your health care provider. Make sure you discuss any questions you have with your health care provider.   Document Released: 10/06/2005 Document Revised: 10/27/2014 Document Reviewed: 08/10/2013 Elsevier Interactive Patient Education 2016 Elsevier Inc.  Increase Pepcid to 20 mg twice daiiy OR Zantac 150 mg twice daily.

## 2016-06-11 NOTE — Progress Notes (Signed)
Subjective:     Patient ID: Shelley Jones, female   DOB: 1951-06-29, 65 y.o.   MRN: [id number]  HPI Patient is seen with some reflux symptoms earlier today. She has long history of reflux and actually had recent EGD with dilation of Schatzki ring. She has known hiatal hernia. She had particularly severe episode of substernal burning and solid food dysphagia on July 9 after eating at [employer] and taking just a few bites. She had some subsequent diaphoresis. She was seen by GI and had dilatation middle July. She recently been taking PPI for several months but was transitioned over to Pepcid and currently taking only 10 mg twice daily. She has noted that things like Metamucil also exacerbate her reflux. Minimal caffeine use. She is elevated head of bed. No alcohol use. No meds. No chocolate.  With episode earlier today she does recall having some fried flounder last night and also ate some peanut butter which may have contributed to her symptoms. She's had previous cholecystectomy. Symptoms earlier today and last night included water brash and burning substernal symptoms.  She is attempting to lose some weight due to her efforts.  Past Medical History:  Diagnosis Date  . Allergic rhinitis   . Anxiety   . Asthma   . Colitis   . Diverticulitis    sees Dr. Deatra Ina  . DVT (deep venous thrombosis) ([place])   . Fibromyalgia   . Gynecological examination    sees Dr. Corinna Capra  . Hyperlipidemia   . IBS (irritable bowel syndrome)   . Interstitial cystitis    sees Dr. Ellard Artis   . Proctitis   . Restless leg   . Shingles   . Uterine fibroids affecting pregnancy    Past Surgical History:  Procedure Laterality Date  . CHOLECYSTECTOMY    . COLONOSCOPY     all attempts at a colonoscopy (last in 1995) were unsuccessful due to a very tortuous colon  . TONSILLECTOMY    . UTERINE FIBROID SURGERY  08/2015    reports that she has never smoked. She has never used smokeless tobacco. She reports that she does  not drink alcohol or use drugs. family history includes Cancer in her other; Diabetes in her other; Glaucoma in her mother; Heart attack in her mother; Hypertension in her other; Stroke in her mother. Allergies  Allergen Reactions  . Midazolam Shortness Of Breath    Reaction: unknown  . Nalbuphine Shortness Of Breath    Reaction: unknown SOB  . Cefdinir     Constipation & abdominal pain  . Iodine Itching and Swelling  . 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  She received benadryl and was ok afterward.   . Lipitor [Atorvastatin]     myalgias  . Meperidine Hcl Nausea And Vomiting  . Novocain [Procaine]     With epinephrine  . Prednisone     Heart races  . Clarithromycin Swelling and Rash     Review of Systems  Constitutional: Negative for appetite change, chills and fever.  HENT: Negative for trouble swallowing.   Respiratory: Negative for shortness of breath.   Cardiovascular: Negative for chest pain.  Gastrointestinal: Negative for nausea and vomiting.       Objective:   Physical Exam  Constitutional: She appears well-developed and well-nourished. No distress.  HENT:  Mouth/Throat: Oropharynx is clear and moist.  Neck: Neck supple. No thyromegaly present.  Cardiovascular: Normal rate and regular rhythm.   Pulmonary/Chest: Effort normal and breath sounds normal. No respiratory distress. She has no wheezes. She has no rales.  Abdominal: Soft. She exhibits no mass. There is no tenderness. There is no rebound and no guarding.       Assessment:     #1 GERD with recent breakthrough symptoms as above after recent discontinuation of PPI  #2 recent dilatation of Schatzki's ring    Plan:     -Long discussion with patient regarding dietary factors. Avoid fried foods. Continue to minimize caffeine. Minimize fat intake. Handout given. Continue elevate head of bed -Increase  Pepcid to 20 mg twice a day -Consider adding back either omeprazole or Nexium over the next several days if symptoms not improved  Eulas Post MD [facility]

## 2016-06-12 ENCOUNTER — Telehealth: Payer: Self-pay

## 2016-06-12 NOTE — Telephone Encounter (Signed)
Called and left MS Doolen a message about her copay from  [place] 4.25.17, system shows she paid $10, but when I print receipt it shows a refund of $10, then system is also showing balance of $15. I have emailed [employer] billing waiting on response, then I will call her back.

## 2016-08-15 ENCOUNTER — Ambulatory Visit: Payer: BC Managed Care – PPO

## 2016-08-19 ENCOUNTER — Ambulatory Visit (INDEPENDENT_AMBULATORY_CARE_PROVIDER_SITE_OTHER): Payer: BC Managed Care – PPO

## 2016-08-19 DIAGNOSIS — Z23 Encounter for immunization: Secondary | ICD-10-CM

## 2016-09-18 ENCOUNTER — Ambulatory Visit: Payer: BC Managed Care – PPO | Admitting: Diagnostic Neuroimaging

## 2016-10-26 ENCOUNTER — Ambulatory Visit (INDEPENDENT_AMBULATORY_CARE_PROVIDER_SITE_OTHER): Payer: BC Managed Care – PPO

## 2016-10-26 ENCOUNTER — Ambulatory Visit
Admission: EM | Admit: 2016-10-26 | Discharge: 2016-10-26 | Disposition: A | Discharge status: Home / Self Care | Payer: BC Managed Care – PPO | Attending: Emergency Medicine | Admitting: Emergency Medicine

## 2016-10-26 ENCOUNTER — Encounter: Payer: Self-pay | Admitting: Emergency Medicine

## 2016-10-26 DIAGNOSIS — K5732 Diverticulitis of large intestine without perforation or abscess without bleeding: Secondary | ICD-10-CM | POA: Diagnosis not present

## 2016-10-26 LAB — CBC WITH DIFFERENTIAL/PLATELET
Basophils Absolute: 0.1 10*3/uL (ref 0–0.1)
Basophils Relative: 1 %
EOS ABS: 0.1 10*3/uL (ref 0–0.7)
EOS PCT: 0 %
HCT: 43.9 % (ref 35.0–47.0)
HEMOGLOBIN: 14.7 g/dL (ref 12.0–16.0)
LYMPHS ABS: 2.1 10*3/uL (ref 1.0–3.6)
Lymphocytes Relative: 12 %
MCH: 30.3 pg (ref 26.0–34.0)
MCHC: 33.5 g/dL (ref 32.0–36.0)
MCV: 90.4 fL (ref 80.0–100.0)
MONOS PCT: 5 %
Monocytes Absolute: 0.9 10*3/uL (ref 0.2–0.9)
NEUTROS PCT: 82 %
Neutro Abs: 14.2 10*3/uL — ABNORMAL HIGH (ref 1.4–6.5)
Platelets: 287 10*3/uL (ref 150–440)
RBC: 4.85 MIL/uL (ref 3.80–5.20)
RDW: 12.8 % (ref 11.5–14.5)
WBC: 17.4 10*3/uL — ABNORMAL HIGH (ref 3.6–11.0)

## 2016-10-26 LAB — COMPREHENSIVE METABOLIC PANEL
ALBUMIN: 4.5 g/dL (ref 3.5–5.0)
ALT: 17 U/L (ref 14–54)
ANION GAP: 10 (ref 5–15)
AST: 20 U/L (ref 15–41)
Alkaline Phosphatase: 84 U/L (ref 38–126)
BUN: 9 mg/dL (ref 6–20)
CO2: 23 mmol/L (ref 22–32)
Calcium: 9.6 mg/dL (ref 8.9–10.3)
Chloride: 102 mmol/L (ref 101–111)
Creatinine, Ser: 0.91 mg/dL (ref 0.44–1.00)
GFR calc non Af Amer: >60 mL/min (ref >60)
GLUCOSE: 118 mg/dL — AB (ref 65–99)
POTASSIUM: 3.6 mmol/L (ref 3.5–5.1)
SODIUM: 135 mmol/L (ref 135–145)
Total Bilirubin: 1.3 mg/dL — ABNORMAL HIGH (ref 0.3–1.2)
Total Protein: 8 g/dL (ref 6.5–8.1)

## 2016-10-26 LAB — URINALYSIS, COMPLETE (UACMP) WITH MICROSCOPIC
BACTERIA UA: NONE SEEN
BILIRUBIN URINE: NEGATIVE
Glucose, UA: NEGATIVE mg/dL
Hgb urine dipstick: NEGATIVE
KETONES UR: NEGATIVE mg/dL
LEUKOCYTES UA: NEGATIVE
NITRITE: NEGATIVE
PH: 7.5 (ref 5.0–8.0)
Protein, ur: NEGATIVE mg/dL
RBC / HPF: NONE SEEN RBC/hpf (ref 0–5)
Specific Gravity, Urine: 1.02 (ref 1.005–1.030)

## 2016-10-26 LAB — OCCULT BLOOD X 1 CARD TO LAB, STOOL: FECAL OCCULT BLD: NEGATIVE

## 2016-10-26 MED ORDER — ONDANSETRON 4 MG PO TBDP: 4.0000 mg | ORAL_TABLET | Freq: Three times a day (TID) | ORAL | 0 refills | Status: DC | PRN

## 2016-10-26 MED ORDER — CIPROFLOXACIN HCL 500 MG PO TABS: 500.0000 mg | ORAL_TABLET | Freq: Two times a day (BID) | ORAL | 0 refills | Status: DC

## 2016-10-26 MED ORDER — ACETAMINOPHEN 500 MG PO TABS
1000.0000 mg | ORAL_TABLET | Freq: Once | ORAL | Status: AC
  Administered 2016-10-26: 1000 mg via ORAL

## 2016-10-26 MED ORDER — ONDANSETRON 4 MG PO TBDP
4.0000 mg | ORAL_TABLET | Freq: Once | ORAL | Status: AC
  Administered 2016-10-26: 4 mg via ORAL

## 2016-10-26 MED ORDER — METRONIDAZOLE 500 MG PO TABS: 500.0000 mg | ORAL_TABLET | Freq: Three times a day (TID) | ORAL | 0 refills | Status: DC

## 2016-10-26 NOTE — ED Triage Notes (Signed)
Patient c/o left sided lower abdominal pain that started on Wed.  Patient reports nausea low grade fever and loose stools since Friday.

## 2016-10-26 NOTE — ED Provider Notes (Signed)
HPI  SUBJECTIVE:  Shelley Jones is a 66 y.o. female who presents with body aches, left lower quadrant pain, rectal pain and a change in bowel habits starting 3 days ago. She states that initially she had soft, pale stools, and then started having watery, nonbloody diarrhea that lasted "all day". She took Pepto which seemed to help, but gave her dark stools. She reports nausea last night and a worsening of her left lower quadrant pain. She describes as nonradiating, nonmigratory, soreness with sitting, stabbing with palpitation. She states it is constant. She denies fevers above 100.4, reports chills. She states today she is having very small stools today. She reports some bright red blood on the toilet paper, but she states that she has hemorrhoids and that they are not bothering her at this point time. She denies melena, hematochezia. She has tried canasa which is a suppository for proctitis with improvement in her rectal pain, Bentyl, Cipro 500 mg 1 last night. She states that this has helped. She is also tried sitz baths Pepto-Bismol. Symptoms are worse with sitting down, bending, twisting, palpitation, walking, defecation. She states that the car ride over here was not particularly painful. She denies chest pain, shortness of breath, coughing, wheezing, new or different back pain, abdominal distention. She reports bloating. She denies syncope, change in urine output. She reports no new dysuria, urgency, frequency. No cloudy or odorous urine, hematuria. No vaginal bleeding, discharge. She states that this is similar to previous diverticulitis flares. No antipyretic in the past 6-8 hours. 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 Last colonoscopy was in 1995, chart states that all attempts a colonoscopy were unsuccessful due to a tortuous colon. She  has history of frequent UTIs, and nonobstructing nephrolithiasis. no history of pyelonephritis No history of perforation, obstruction, coronary disease, MI, atrial fibrillation, mesenteric ischemia. GI: Dr. Earlean Shawl in [place], PMD: Dr. Delma Freeze in [place].  Past Medical History:  Diagnosis Date  . Allergic rhinitis   . Anxiety   . Asthma   . Colitis   . Diverticulitis    sees Dr. Deatra Ina  . DVT (deep venous thrombosis) ([place])   . Fibromyalgia   . Gynecological examination    sees Dr. Corinna Capra  . Hyperlipidemia   . IBS (irritable bowel syndrome)   . Interstitial cystitis    sees Dr. Ellard Artis   . Proctitis   . Restless leg   . Shingles   . Uterine fibroids affecting pregnancy     Past Surgical History:  Procedure Laterality Date  . CHOLECYSTECTOMY    . COLONOSCOPY     all attempts at a colonoscopy (last in 1995) were unsuccessful due to a very tortuous colon  . TONSILLECTOMY    . UTERINE FIBROID SURGERY  08/2015    Family History  Problem Relation Age of Onset  . Glaucoma Mother   . Heart attack Mother   . Stroke Mother   . Hypertension Other   . Cancer Other     bone, colon  . Diabetes Other     Social History  Substance Use Topics  . Smoking status: Never Smoker  . Smokeless tobacco: Never Used  . Alcohol use No    No current facility-administered medications for this encounter.   Current Outpatient Prescriptions:  Marland Kitchen  Multiple Vitamin (MULTIVITAMIN) tablet, Take 1 tablet by mouth daily., Disp: , Rfl:  .  ALPRAZolam Duanne Moron)  0.25 MG tablet, Take 1 tablet (0.25 mg total) by mouth 3 (three) times daily as needed for anxiety. (Patient taking differently: Take 0.25 mg by mouth 3 (three) times daily as needed for anxiety. 1/2 tablet), Disp: 60 tablet, Rfl: 2 .  BIOTIN PO, Take 500 mg by mouth daily., Disp: , Rfl:  .  calcium carbonate (TUMS EX) 750 MG chewable tablet, Chew 2 tablets by mouth daily. , Disp: , Rfl:  .  ciprofloxacin (CIPRO) 500 MG tablet, Take 1 tablet  (500 mg total) by mouth 2 (two) times daily., Disp: 14 tablet, Rfl: 0 .  cyanocobalamin 500 MCG tablet, Take 500 mcg by mouth daily., Disp: , Rfl:  .  dicyclomine (BENTYL) 20 MG tablet, Take 1 tablet (20 mg total) by mouth 3 (three) times daily as needed for spasms., Disp: 30 tablet, Rfl: 0 .  hydrOXYzine (ATARAX/VISTARIL) 10 MG tablet, Take 1 tablet (10 mg total) by mouth at bedtime., Disp: 30 tablet, Rfl: 0 .  MELATONIN PO, Take 2 capsules by mouth at bedtime., Disp: , Rfl:  .  metroNIDAZOLE (FLAGYL) 500 MG tablet, Take 1 tablet (500 mg total) by mouth 3 (three) times daily., Disp: 21 tablet, Rfl: 0 .  Multiple Vitamins-Minerals (MULTIVITAMIN ADULT PO), Take by mouth daily., Disp: , Rfl:  .  ondansetron (ZOFRAN ODT) 4 MG disintegrating tablet, Take 1 tablet (4 mg total) by mouth every 8 (eight) hours as needed for nausea or vomiting., Disp: 20 tablet, Rfl: 0 .  ranitidine (ZANTAC) 75 MG tablet, Take 75 mg by mouth as needed. , Disp: , Rfl:  .  valACYclovir (VALTREX) 500 MG tablet, Take 500 mg by mouth 2 (two) times daily. As needed, Disp: , Rfl:  .  VENTOLIN HFA 108 (90 Base) MCG/ACT inhaler, Inhale 2 puffs into the lungs every 4 (four) hours as needed for wheezing or shortness of breath., Disp: 1 Inhaler, Rfl: 11 .  VITAMIN D, CHOLECALCIFEROL, PO, Take 500 mg by mouth daily., Disp: , Rfl:  .  Wheat Dextrin (BENEFIBER PO), Take by mouth daily., Disp: , Rfl:   Allergies  Allergen Reactions  . Midazolam Shortness Of Breath    Reaction: unknown  . Nalbuphine Shortness Of Breath    Reaction: unknown SOB  . Shellfish Allergy Anaphylaxis  . Cefdinir     Constipation & abdominal pain  . Demerol [Meperidine] Nausea And Vomiting  . Iodine Itching and Swelling  . 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  She received benadryl and was ok afterward.   . Lipitor [Atorvastatin]     myalgias  .  Meperidine Hcl Nausea And Vomiting  . Novocain [Procaine]     With epinephrine  . Ofloxacin Other (See Comments)    Reaction: unknown  . Prednisone     Heart races  . Clarithromycin Swelling and Rash     ROS  As noted in HPI.   Physical Exam  BP (!) 150/75 (BP Location: Left Arm)   Pulse 73   Temp 99.1 F (37.3 C) (Oral)   Resp 16   Ht 5\' 1"  (1.549 m)   Wt 138 lb (62.6 kg)   SpO2 98%   BMI 26.07 kg/m   Constitutional: Well developed, well nourished, no acute distress Eyes: PERRL, EOMI, conjunctiva normal bilaterally HENT: Normocephalic, atraumatic,mucus membranes moist Respiratory: Clear to auscultation bilaterally, no rales, no wheezing, no  rhonchi Cardiovascular: Normal rate and rhythm, no murmurs, no gallops, no rubs GI: Healed laparoscopic cholecystectomy scars, Soft, nondistended, normal bowel sounds, mild right lower quadrant and suprapubic tenderness. Positive left lower quadrant tenderness,, no rebound, no guarding. No palpable masses. Negative Murphy. Rectal: Positive external nonthrombosed hemorrhoids and skin tags. Increased rectal tone, soft yellowish-brown stool on glove. Back: no CVAT skin: No rash, skin intact Musculoskeletal: No edema, no tenderness, no deformities Neurologic: Alert & oriented x 3, CN II-XII grossly intact, no motor deficits, sensation grossly intact Psychiatric: Speech and behavior appropriate   ED Course   Medications  ondansetron (ZOFRAN-ODT) disintegrating tablet 4 mg (4 mg Oral Given 10/26/16 1039)  acetaminophen (TYLENOL) tablet 1,000 mg (1,000 mg Oral Given 10/26/16 1157)    Orders Placed This Encounter  Procedures  . DG Abd Acute W/Chest    Standing Status:   Standing    Number of Occurrences:   1    Order Specific Question:   Reason for Exam (SYMPTOM  OR DIAGNOSIS REQUIRED)    Answer:   LLQ pain r/o perforation free air obstruction  . Comprehensive metabolic panel    Standing Status:   Standing    Number of Occurrences:    1  . Urinalysis, Complete w Microscopic    Standing Status:   Standing    Number of Occurrences:   1  . CBC with Differential    Standing Status:   Standing    Number of Occurrences:   1  . Occult blood card to lab, stool Provider will collect    Standing Status:   Standing    Number of Occurrences:   1    Order Specific Question:   Specimen to be collected by?    Answer:   Provider will collect   Results for orders placed or performed during the hospital encounter of 10/26/16 (from the past 24 hour(s))  Comprehensive metabolic panel     Status: Abnormal   Collection Time: 10/26/16 11:54 AM  Result Value Ref Range   Sodium 135 135 - 145 mmol/L   Potassium 3.6 3.5 - 5.1 mmol/L   Chloride 102 101 - 111 mmol/L   CO2 23 22 - 32 mmol/L   Glucose, Bld 118 (H) 65 - 99 mg/dL   BUN 9 6 - 20 mg/dL   Creatinine, Ser 0.91 0.44 - 1.00 mg/dL   Calcium 9.6 8.9 - 10.3 mg/dL   Total Protein 8.0 6.5 - 8.1 g/dL   Albumin 4.5 3.5 - 5.0 g/dL   AST 20 15 - 41 U/L   ALT 17 14 - 54 U/L   Alkaline Phosphatase 84 38 - 126 U/L   Total Bilirubin 1.3 (H) 0.3 - 1.2 mg/dL   GFR calc non Af Amer >60 >60 mL/min   GFR calc Af Amer >60 >60 mL/min   Anion gap 10 5 - 15  Urinalysis, Complete w Microscopic     Status: Abnormal   Collection Time: 10/26/16 11:54 AM  Result Value Ref Range   Color, Urine YELLOW YELLOW   APPearance CLEAR CLEAR   Specific Gravity, Urine 1.020 1.005 - 1.030   pH 7.5 5.0 - 8.0   Glucose, UA NEGATIVE NEGATIVE mg/dL   Hgb urine dipstick NEGATIVE NEGATIVE   Bilirubin Urine NEGATIVE NEGATIVE   Ketones, ur NEGATIVE NEGATIVE mg/dL   Protein, ur NEGATIVE NEGATIVE mg/dL   Nitrite NEGATIVE NEGATIVE   Leukocytes, UA NEGATIVE NEGATIVE   Squamous Epithelial / LPF 0-5 (A) NONE SEEN  WBC, UA 0-5 0 - 5 WBC/hpf   RBC / HPF NONE SEEN 0 - 5 RBC/hpf   Bacteria, UA NONE SEEN NONE SEEN   Mucous PRESENT   CBC with Differential     Status: Abnormal   Collection Time: 10/26/16 11:54 AM  Result  Value Ref Range   WBC 17.4 (H) 3.6 - 11.0 K/uL   RBC 4.85 3.80 - 5.20 MIL/uL   Hemoglobin 14.7 12.0 - 16.0 g/dL   HCT 43.9 35.0 - 47.0 %   MCV 90.4 80.0 - 100.0 fL   MCH 30.3 26.0 - 34.0 pg   MCHC 33.5 32.0 - 36.0 g/dL   RDW 12.8 11.5 - 14.5 %   Platelets 287 150 - 440 K/uL   Neutrophils Relative % 82 %   Neutro Abs 14.2 (H) 1.4 - 6.5 K/uL   Lymphocytes Relative 12 %   Lymphs Abs 2.1 1.0 - 3.6 K/uL   Monocytes Relative 5 %   Monocytes Absolute 0.9 0.2 - 0.9 K/uL   Eosinophils Relative 0 %   Eosinophils Absolute 0.1 0 - 0.7 K/uL   Basophils Relative 1 %   Basophils Absolute 0.1 0 - 0.1 K/uL  Occult blood card to lab, stool Provider will collect     Status: None   Collection Time: 10/26/16 12:03 PM  Result Value Ref Range   Fecal Occult Bld NEGATIVE NEGATIVE   Dg Abd Acute W/chest  Result Date: 10/26/2016 CLINICAL DATA:  Left lower quadrant pain for 4 days. EXAM: DG ABDOMEN ACUTE W/ 1V CHEST COMPARISON:  CT abdomen and pelvis 04/02/2015. PA and lateral chest 03/23/2015. FINDINGS: Single-view of the chest demonstrates clear lungs and normal heart size. No pneumothorax or pleural effusion. Two views of the abdomen show a normal bowel gas pattern. The patient is status post cholecystectomy. No abnormal abdominal calcification. Cholecystectomy clips noted. IMPRESSION: No acute finding chest or abdomen. Electronically Signed   By: Inge Rise M.D.   On: 10/26/2016 12:47    ED Clinical Impression  Diverticulitis of large intestine without perforation or abscess without bleeding   ED Assessment/Plan  Patient wishes to have a limited workup here. She states that the Zofran made her feel significantly better. She declined shot of Toradol, will give Tylenol 1 g by mouth. She does not have any peritoneal signs, so doubt perforation at this particular point in time. She understands that if she has any abnormal labs or imaging, she will need to go immediately to the ED. She also go  immediately to the ED if her belly pain changes, gets worse, fevers, chest pain, shortness of breath tonight. She states that she can follow up with her primary care physician tomorrow for repeat abdominal exam.  Labs reviewed. She has a leukocytosis with a left shift, but her CBC unremarkable. CMP is otherwise unremarkable except for slightly elevated bilirubin. Imaging independently reviewed. No air-fluid levels. No pneumothorax, pleural effusion, no abnormal abdominal calcification no acute findings in the chest or abdomen per radiology. Urine is normal.  A reevaluation, patient states she feels significantly better after the Tylenol and Zofran. Repeat abdominal exam she still has continued tenderness in left lower quadrant, but no guarding or rebound. Her abdomen is otherwise benign.  We'll treat as a diverticulitis with Flagyl 500 mg 3 times a day with Cipro 500 mg twice a day for 7 days. Zofran as needed for nausea.  Discussed labs, imaging, MDM, plan and followup with patient. Discussed sn/sx that should prompt return to  the ED. Patient agrees with plan.   Meds ordered this encounter  Medications  . Multiple Vitamin (MULTIVITAMIN) tablet    Sig: Take 1 tablet by mouth daily.  . ondansetron (ZOFRAN-ODT) disintegrating tablet 4 mg  . acetaminophen (TYLENOL) tablet 1,000 mg  . metroNIDAZOLE (FLAGYL) 500 MG tablet    Sig: Take 1 tablet (500 mg total) by mouth 3 (three) times daily.    Dispense:  21 tablet    Refill:  0  . ciprofloxacin (CIPRO) 500 MG tablet    Sig: Take 1 tablet (500 mg total) by mouth 2 (two) times daily.    Dispense:  14 tablet    Refill:  0  . ondansetron (ZOFRAN ODT) 4 MG disintegrating tablet    Sig: Take 1 tablet (4 mg total) by mouth every 8 (eight) hours as needed for nausea or vomiting.    Dispense:  20 tablet    Refill:  0    *This clinic note was created using [employer]. Therefore, there may be occasional mistakes despite careful  proofreading.  ?   Melynda Ripple, MD 10/26/16 1325

## 2016-10-27 ENCOUNTER — Ambulatory Visit (INDEPENDENT_AMBULATORY_CARE_PROVIDER_SITE_OTHER): Payer: BC Managed Care – PPO | Admitting: Family Medicine

## 2016-10-27 VITALS — BP 110/80 | HR 68 | Temp 98.0°F | Ht 61.0 in | Wt 139.0 lb

## 2016-10-27 DIAGNOSIS — K5792 Diverticulitis of intestine, part unspecified, without perforation or abscess without bleeding: Secondary | ICD-10-CM

## 2016-10-27 NOTE — Progress Notes (Signed)
Subjective:     Patient ID: Shelley Jones, female   DOB: 03-Nov-1950, 66 y.o.   MRN: [id number]  HPI Patient seen for urgent care follow-up. This past Friday she developed some watery nonbloody diarrhea and peri-rectal pain. No hematochezia. By Saturday her stools were more loose and she had taken some Pepto-Bismol. She noted on Saturday some left lower quadrant abdominal pain and somewhat progressive pain by Sunday. No fevers or but had some mild chills. She noticed some general fatigue and generalized weakness. Decreased appetite but no nausea or vomiting. Patient went to urgent care. Her temperature was 99.1. Lab work significant for white count 17.4 thousand with normal hemoglobin. Urinalysis unremarkable. Comprehensive metabolic panel unremarkable. Diagnostic acute abdomen revealed no acute findings.  Patient has known history of diverticulitis. She was treated for acute diverticulitis flare with Flagyl and Cipro and does feel somewhat better today. She initially had some mild nausea treated with Zofran and has none today. She still has significant amount of pain left lower quadrant but overall somewhat better than yesterday.  Past Medical History:  Diagnosis Date  . Allergic rhinitis   . Anxiety   . Asthma   . Colitis   . Diverticulitis    sees Dr. Deatra Ina  . DVT (deep venous thrombosis) ([place])   . Fibromyalgia   . Gynecological examination    sees Dr. Corinna Capra  . Hyperlipidemia   . IBS (irritable bowel syndrome)   . Interstitial cystitis    sees Dr. Ellard Artis   . Proctitis   . Restless leg   . Shingles   . Uterine fibroids affecting pregnancy    Past Surgical History:  Procedure Laterality Date  . CHOLECYSTECTOMY    . COLONOSCOPY     all attempts at a colonoscopy (last in 1995) were unsuccessful due to a very tortuous colon  . TONSILLECTOMY    . UTERINE FIBROID SURGERY  08/2015    reports that she has never smoked. She has never used smokeless tobacco. She reports that she does not  drink alcohol or use drugs. family history includes Cancer in her other; Diabetes in her other; Glaucoma in her mother; Heart attack in her mother; Hypertension in her other; Stroke in her mother. Allergies  Allergen Reactions  . Midazolam Shortness Of Breath    Reaction: unknown  . Nalbuphine Shortness Of Breath    Reaction: unknown SOB  . Shellfish Allergy Anaphylaxis  . Cefdinir     Constipation & abdominal pain  . Demerol [Meperidine] Nausea And Vomiting  . Iodine Itching and Swelling  . 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  She received benadryl and was ok afterward.   . Lipitor [Atorvastatin]     myalgias  . Meperidine Hcl Nausea And Vomiting  . Novocain [Procaine]     With epinephrine  . Ofloxacin Other (See Comments)    Reaction: unknown  . Prednisone     Heart races  . Clarithromycin Swelling and Rash     Review of Systems  Constitutional: Positive for chills and fatigue. Negative for fever.  Respiratory: Negative for shortness of breath.   Gastrointestinal: Positive for abdominal pain. Negative for blood in stool, constipation, nausea and vomiting.  Genitourinary: Negative for dysuria.  Neurological: Negative for dizziness.       Objective:   Physical Exam  Constitutional: She appears well-developed and well-nourished.  HENT:  Mouth/Throat: Oropharynx is clear and moist.  Cardiovascular: Normal rate and regular rhythm.   Pulmonary/Chest: Effort normal and breath sounds normal. No respiratory distress. She has no wheezes. She has no rales.  Abdominal: Soft. Bowel sounds are normal. She exhibits no distension and no mass. There is tenderness. There is no rebound and no guarding.  Patient has significant tenderness left lower quadrant. No guarding. No mass. Abdomen soft       Assessment:     Abdominal pain left lower quadrant. Suspect acute diverticulitis.  Symptoms slightly improved after initiation late yesterday of Flagyl and Cipro    Plan:     -Finish out antibiotics with Cipro and Flagyl -Stay well-hydrated -Follow-up immediately for any increasing fever or worsening pain  Eulas Post MD [facility]

## 2016-10-27 NOTE — Progress Notes (Signed)
Pre visit review using our clinic review tool, if applicable. No additional management support is needed unless otherwise documented below in the visit note. 

## 2016-10-27 NOTE — Patient Instructions (Signed)
Diverticulitis Diverticulitis is inflammation or infection of small pouches in your colon that form when you have a condition called diverticulosis. The pouches in your colon are called diverticula. Your colon, or large intestine, is where water is absorbed and stool is formed. Complications of diverticulitis can include:  Bleeding.  Severe infection.  Severe pain.  Perforation of your colon.  Obstruction of your colon. What are the causes? Diverticulitis is caused by bacteria. Diverticulitis happens when stool becomes trapped in diverticula. This allows bacteria to grow in the diverticula, which can lead to inflammation and infection. What increases the risk? People with diverticulosis are at risk for diverticulitis. Eating a diet that does not include enough fiber from fruits and vegetables may make diverticulitis more likely to develop. What are the signs or symptoms? Symptoms of diverticulitis may include:  Abdominal pain and tenderness. The pain is normally located on the left side of the abdomen, but may occur in other areas.  Fever and chills.  Bloating.  Cramping.  Nausea.  Vomiting.  Constipation.  Diarrhea.  Blood in your stool. How is this diagnosed? Your health care provider will ask you about your medical history and do a physical exam. You may need to have tests done because many medical conditions can cause the same symptoms as diverticulitis. Tests may include:  Blood tests.  Urine tests.  Imaging tests of the abdomen, including X-rays and CT scans. When your condition is under control, your health care provider may recommend that you have a colonoscopy. A colonoscopy can show how severe your diverticula are and whether something else is causing your symptoms. How is this treated? Most cases of diverticulitis are mild and can be treated at home. Treatment may include:  Taking over-the-counter pain medicines.  Following a clear liquid diet.  Taking  antibiotic medicines by mouth for 7-10 days. More severe cases may be treated at a hospital. Treatment may include:  Not eating or drinking.  Taking prescription pain medicine.  Receiving antibiotic medicines through an IV tube.  Receiving fluids and nutrition through an IV tube.  Surgery. Follow these instructions at home:  Follow your health care provider's instructions carefully.  Follow a full liquid diet or other diet as directed by your health care provider. After your symptoms improve, your health care provider may tell you to change your diet. He or she may recommend you eat a high-fiber diet. Fruits and vegetables are good sources of fiber. Fiber makes it easier to pass stool.  Take fiber supplements or probiotics as directed by your health care provider.  Only take medicines as directed by your health care provider.  Keep all your follow-up appointments. Contact a health care provider if:  Your pain does not improve.  You have a hard time eating food.  Your bowel movements do not return to normal. Get help right away if:  Your pain becomes worse.  Your symptoms do not get better.  Your symptoms suddenly get worse.  You have a fever.  You have repeated vomiting.  You have bloody or black, tarry stools. This information is not intended to replace advice given to you by your health care provider. Make sure you discuss any questions you have with your health care provider. Document Released: 07/16/2005 Document Revised: 03/13/2016 Document Reviewed: 08/31/2013 Elsevier Interactive Patient Education  2017 [place] out the antibiotics prescribed Follow up for any increased fever, vomiting, or increasing pain.

## 2016-10-29 ENCOUNTER — Ambulatory Visit (INDEPENDENT_AMBULATORY_CARE_PROVIDER_SITE_OTHER): Payer: BC Managed Care – PPO | Admitting: Family Medicine

## 2016-10-29 VITALS — BP 120/82 | HR 67 | Temp 97.5°F | Ht 61.0 in | Wt 140.0 lb

## 2016-10-29 DIAGNOSIS — R1032 Left lower quadrant pain: Secondary | ICD-10-CM

## 2016-10-29 DIAGNOSIS — R197 Diarrhea, unspecified: Secondary | ICD-10-CM

## 2016-10-29 NOTE — Patient Instructions (Addendum)

## 2016-10-29 NOTE — Progress Notes (Signed)
Pre visit review using our clinic review tool, if applicable. No additional management support is needed unless otherwise documented below in the visit note. 
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
## 2016-10-30 LAB — BASIC METABOLIC PANEL
BUN: 12 mg/dL (ref 6–23)
CHLORIDE: 106 meq/L (ref 96–112)
CO2: 26 mEq/L (ref 19–32)
Calcium: 9.2 mg/dL (ref 8.4–10.5)
Creatinine, Ser: 0.95 mg/dL (ref 0.40–1.20)
GFR: 62.77 mL/min (ref >60.00)
GLUCOSE: 93 mg/dL (ref 70–99)
POTASSIUM: 3.9 meq/L (ref 3.5–5.1)
Sodium: 141 mEq/L (ref 135–145)

## 2016-10-30 LAB — CBC WITH DIFFERENTIAL/PLATELET
BASOS PCT: 0.5 % (ref 0.0–3.0)
Basophils Absolute: 0 10*3/uL (ref 0.0–0.1)
EOS ABS: 0.2 10*3/uL (ref 0.0–0.7)
Eosinophils Relative: 2.7 % (ref 0.0–5.0)
HEMATOCRIT: 39.1 % (ref 36.0–46.0)
HEMOGLOBIN: 13.2 g/dL (ref 12.0–15.0)
LYMPHS PCT: 23 % (ref 12.0–46.0)
Lymphs Abs: 2 10*3/uL (ref 0.7–4.0)
MCHC: 33.8 g/dL (ref 30.0–36.0)
MCV: 90.4 fl (ref 78.0–100.0)
Monocytes Absolute: 0.8 10*3/uL (ref 0.1–1.0)
Monocytes Relative: 8.7 % (ref 3.0–12.0)
Neutro Abs: 5.7 10*3/uL (ref 1.4–7.7)
Neutrophils Relative %: 65.1 % (ref 43.0–77.0)
Platelets: 310 10*3/uL (ref 150.0–400.0)
RBC: 4.33 Mil/uL (ref 3.87–5.11)
RDW: 12.9 % (ref 11.5–15.5)
WBC: 8.7 10*3/uL (ref 4.0–10.5)

## 2016-10-31 ENCOUNTER — Telehealth: Payer: Self-pay

## 2016-10-31 ENCOUNTER — Encounter: Payer: Self-pay | Admitting: Family Medicine

## 2016-10-31 NOTE — Telephone Encounter (Signed)
Pt would like to see if there is something other than CIPRO she can take.  Pharm:  [facility] in [place].

## 2016-10-31 NOTE — Telephone Encounter (Signed)
Pt is concerned about Cipro, she was given a 4 page side effect precaution from the pharmacy. Pt c/o Cipro causing her ankle to roll and it is happening every time she walks. She is afraid she may break her foot. She states that the last time she was on Cipro she had the same problem and then broke her foot. She is afraid to stay alone since she may fall and break her foot so she has been staying with her daughter. She would like to know if you can call in something else that does not have this side effect.   Dr. Sarajane Jews - Please advise. Thanks!

## 2016-11-02 NOTE — Telephone Encounter (Signed)
Cancel the Cipro and call in Augmentin 875 mg bid for 10 days

## 2016-11-03 ENCOUNTER — Telehealth: Payer: Self-pay | Admitting: Family Medicine

## 2016-11-03 MED ORDER — FLUCONAZOLE 150 MG PO TABS
150.0000 mg | ORAL_TABLET | Freq: Once | ORAL | 5 refills | Status: AC
Start: 2016-11-03 — End: 2016-11-03

## 2016-11-03 NOTE — Telephone Encounter (Signed)
I spoke with pt and [facility] 

## 2016-11-03 NOTE — Telephone Encounter (Addendum)
Pt states the abx from the walk in clinic has given her itching on her botton and throat.. Also has white spots in her throat.  Pt states she saw Dr Elease Hashimoto 2 X last week for the follow up on her UC visit. She was on 2 abx when she saw Dr Elease Hashimoto last week.  Pt would like Dr to call in a pill for the yeast infection ASAP.  Pt has stopped the abx until she can get somehting for the yeast. [facility] [address] [place]

## 2016-11-03 NOTE — Telephone Encounter (Signed)
I spoke with pt and she is requesting Diflucan for yeast problem now. She is finishing the Cipro and doesn't want to change antibiotics at this time. Per Dr. Sarajane Jews call in Diflucan 150 mg take 1 po # 1 with 5 refills.

## 2016-11-03 NOTE — Telephone Encounter (Signed)
Fluconazole 150 mg times one dose.

## 2016-11-03 NOTE — Telephone Encounter (Signed)
Please advise on medication

## 2016-11-04 MED ORDER — FLUCONAZOLE 150 MG PO TABS: 150.0000 mg | ORAL_TABLET | Freq: Once | ORAL | 0 refills | Status: AC

## 2016-11-04 NOTE — Telephone Encounter (Signed)
Medication sent in for patient. 

## 2016-11-25 ENCOUNTER — Other Ambulatory Visit (INDEPENDENT_AMBULATORY_CARE_PROVIDER_SITE_OTHER): Payer: Medicare Other

## 2016-11-25 DIAGNOSIS — Z0000 Encounter for general adult medical examination without abnormal findings: Secondary | ICD-10-CM

## 2016-11-25 LAB — BASIC METABOLIC PANEL
BUN: 11 mg/dL (ref 6–23)
CO2: 31 mEq/L (ref 19–32)
CREATININE: 0.87 mg/dL (ref 0.40–1.20)
Calcium: 9.5 mg/dL (ref 8.4–10.5)
Chloride: 103 mEq/L (ref 96–112)
GFR: 69.46 mL/min (ref >60.00)
Glucose, Bld: 90 mg/dL (ref 70–99)
Potassium: 4.4 mEq/L (ref 3.5–5.1)
Sodium: 140 mEq/L (ref 135–145)

## 2016-11-25 LAB — POC URINALSYSI DIPSTICK (AUTOMATED)
BILIRUBIN UA: NEGATIVE
Glucose, UA: NEGATIVE
KETONES UA: NEGATIVE
Nitrite, UA: NEGATIVE
PROTEIN UA: NEGATIVE
SPEC GRAV UA: 1.02
Urobilinogen, UA: 0.2
pH, UA: 8

## 2016-11-25 LAB — HEPATIC FUNCTION PANEL
ALT: 13 U/L (ref 0–35)
AST: 14 U/L (ref 0–37)
Albumin: 4 g/dL (ref 3.5–5.2)
Alkaline Phosphatase: 77 U/L (ref 39–117)
BILIRUBIN DIRECT: 0.1 mg/dL (ref 0.0–0.3)
BILIRUBIN TOTAL: 0.7 mg/dL (ref 0.2–1.2)
Total Protein: 6.8 g/dL (ref 6.0–8.3)

## 2016-11-25 LAB — CBC WITH DIFFERENTIAL/PLATELET
Basophils Absolute: 0.1 10*3/uL (ref 0.0–0.1)
Basophils Relative: 0.9 % (ref 0.0–3.0)
EOS ABS: 0.3 10*3/uL (ref 0.0–0.7)
Eosinophils Relative: 3 % (ref 0.0–5.0)
HCT: 41.1 % (ref 36.0–46.0)
HEMOGLOBIN: 13.8 g/dL (ref 12.0–15.0)
Lymphocytes Relative: 26.3 % (ref 12.0–46.0)
Lymphs Abs: 2.4 10*3/uL (ref 0.7–4.0)
MCHC: 33.7 g/dL (ref 30.0–36.0)
MCV: 92.3 fl (ref 78.0–100.0)
MONO ABS: 0.6 10*3/uL (ref 0.1–1.0)
Monocytes Relative: 7.1 % (ref 3.0–12.0)
Neutro Abs: 5.6 10*3/uL (ref 1.4–7.7)
Neutrophils Relative %: 62.7 % (ref 43.0–77.0)
Platelets: 272 10*3/uL (ref 150.0–400.0)
RBC: 4.45 Mil/uL (ref 3.87–5.11)
RDW: 13.3 % (ref 11.5–15.5)
WBC: 9 10*3/uL (ref 4.0–10.5)

## 2016-11-25 LAB — LIPID PANEL
CHOL/HDL RATIO: 6
Cholesterol: 285 mg/dL — ABNORMAL HIGH (ref 0–200)
HDL: 48.9 mg/dL (ref >39.00)
LDL CALC: 211 mg/dL — AB (ref 0–99)
NonHDL: 236.21
TRIGLYCERIDES: 128 mg/dL (ref 0.0–149.0)
VLDL: 25.6 mg/dL (ref 0.0–40.0)

## 2016-11-25 LAB — TSH: TSH: 2.27 u[IU]/mL (ref 0.35–4.50)

## 2016-12-02 ENCOUNTER — Encounter: Payer: Self-pay | Admitting: Family Medicine

## 2016-12-02 ENCOUNTER — Ambulatory Visit (INDEPENDENT_AMBULATORY_CARE_PROVIDER_SITE_OTHER): Payer: Medicare Other | Admitting: Family Medicine

## 2016-12-02 VITALS — BP 130/76 | HR 60 | Temp 98.1°F | Ht 61.0 in | Wt 140.0 lb

## 2016-12-02 DIAGNOSIS — Z0000 Encounter for general adult medical examination without abnormal findings: Secondary | ICD-10-CM

## 2016-12-02 DIAGNOSIS — Z209 Contact with and (suspected) exposure to unspecified communicable disease: Secondary | ICD-10-CM

## 2016-12-02 MED ORDER — ALPRAZOLAM 0.25 MG PO TABS: 0.2500 mg | ORAL_TABLET | Freq: Three times a day (TID) | ORAL | 2 refills | Status: DC | PRN

## 2016-12-02 MED ORDER — ROSUVASTATIN CALCIUM 10 MG PO TABS: ORAL_TABLET | ORAL | 11 refills | Status: AC

## 2016-12-02 MED ORDER — HYDROXYZINE HCL 10 MG PO TABS: 10.0000 mg | ORAL_TABLET | Freq: Every day | ORAL | 11 refills | Status: DC

## 2016-12-02 NOTE — Progress Notes (Signed)
   Subjective:    Patient ID: Shelley Jones, female    DOB: July 13, 1951, 66 y.o.   MRN: [id number]  HPI 66 yr old female for a well exam. In general she is doing well. She recently was treated for another bout of diverticulitis with Cipro and Flagyl. She has totally recovered. She recently saw Dr. Earlean Shawl and he wants to review all her CT scans and he would like to perform a colonoscopy using a pediatric scope. Depending on the location and extent of her diverticulosis, she may be a candidate for surgical resection. Otherwise she feels well.    Review of Systems  Constitutional: Negative.   HENT: Negative.   Eyes: Negative.   Respiratory: Negative.   Cardiovascular: Negative.   Gastrointestinal: Negative.   Genitourinary: Negative for decreased urine volume, difficulty urinating, dyspareunia, dysuria, enuresis, flank pain, frequency, hematuria, pelvic pain and urgency.  Musculoskeletal: Negative.   Skin: Negative.   Neurological: Negative.   Psychiatric/Behavioral: Negative.        Objective:   Physical Exam  Constitutional: She is oriented to person, place, and time. She appears well-developed and well-nourished. No distress.  HENT:  Head: Normocephalic and atraumatic.  Right Ear: External ear normal.  Left Ear: External ear normal.  Nose: Nose normal.  Mouth/Throat: Oropharynx is clear and moist. No oropharyngeal exudate.  Eyes: Conjunctivae and EOM are normal. Pupils are equal, round, and reactive to light. No scleral icterus.  Neck: Normal range of motion. Neck supple. No JVD present. No thyromegaly present.  Cardiovascular: Normal rate, regular rhythm, normal heart sounds and intact distal pulses.  Exam reveals no gallop and no friction rub.   No murmur heard. Pulmonary/Chest: Effort normal and breath sounds normal. No respiratory distress. She has no wheezes. She has no rales. She exhibits no tenderness.  Abdominal: Soft. Bowel sounds are normal. She exhibits no distension  and no mass. There is no tenderness. There is no rebound and no guarding.  Musculoskeletal: Normal range of motion. She exhibits no edema or tenderness.  Lymphadenopathy:    She has no cervical adenopathy.  Neurological: She is alert and oriented to person, place, and time. She has normal reflexes. No cranial nerve deficit. She exhibits normal muscle tone. Coordination normal.  Skin: Skin is warm and dry. No rash noted. No erythema.  Psychiatric: She has a normal mood and affect. Her behavior is normal. Judgment and thought content normal.          Assessment & Plan:  Well exam. We discussed diet and exercise. Her labs show her LDL jumped up to 211 after stopping Crestor. She had a lot of muscle pains on te 10 mg dose. She will get back on this but lower the dosing to 10 mg every other day.  Alysia Penna, MD

## 2016-12-02 NOTE — Progress Notes (Signed)
Pre visit review using our clinic review tool, if applicable. No additional management support is needed unless otherwise documented below in the visit note. 

## 2016-12-22 ENCOUNTER — Other Ambulatory Visit (INDEPENDENT_AMBULATORY_CARE_PROVIDER_SITE_OTHER): Payer: Medicare Other

## 2016-12-22 DIAGNOSIS — Z209 Contact with and (suspected) exposure to unspecified communicable disease: Secondary | ICD-10-CM

## 2016-12-23 LAB — HEPATITIS C ANTIBODY: HCV Ab: NEGATIVE

## 2016-12-24 NOTE — Progress Notes (Unsigned)
Hep C was negative.

## 2017-01-06 ENCOUNTER — Encounter: Payer: Self-pay | Admitting: Family Medicine

## 2017-01-06 LAB — HM COLONOSCOPY

## 2017-01-26 ENCOUNTER — Telehealth: Payer: Self-pay | Admitting: Family Medicine

## 2017-01-26 NOTE — Telephone Encounter (Signed)
I spoke with pt and went over below advice, pt agreed to call their office back to change antibiotic.

## 2017-01-26 NOTE — Telephone Encounter (Signed)
There is no mention of an erythromycin reaction specifically in her chart, but she did have swelling and rash from Clarithromycin (Biaxin) which is in the erythromycin family. I suggest she NOT take erythromycin and that they give her a different antibiotic

## 2017-01-26 NOTE — Telephone Encounter (Signed)
Pt is having GI surgery on Wed am at [facility].  Pt states she is supposed to take the abx erythromycin.  but [facility] contacted her and advised she is allergic to that med Pt states she does not recall being allergic to this med.  Would like a call back to confirm one way or another, because they want her to start this med today.

## 2017-01-28 HISTORY — PX: LAPAROSCOPIC PARTIAL COLECTOMY: SHX5907

## 2017-02-02 ENCOUNTER — Encounter: Payer: Self-pay | Admitting: Family Medicine

## 2017-02-03 ENCOUNTER — Encounter: Payer: Self-pay | Admitting: Family Medicine

## 2017-02-03 ENCOUNTER — Ambulatory Visit (INDEPENDENT_AMBULATORY_CARE_PROVIDER_SITE_OTHER): Payer: Medicare Other | Admitting: Family Medicine

## 2017-02-03 VITALS — BP 128/72 | Temp 98.4°F | Ht 61.0 in | Wt 134.0 lb

## 2017-02-03 DIAGNOSIS — D490 Neoplasm of unspecified behavior of digestive system: Secondary | ICD-10-CM

## 2017-02-03 NOTE — Progress Notes (Signed)
Pre visit review using our clinic review tool, if applicable. No additional management support is needed unless otherwise documented below in the visit note. 

## 2017-02-03 NOTE — Progress Notes (Signed)
   Subjective:    Patient ID: Shelley Jones, female    DOB: 1951-05-25, 66 y.o.   MRN: [id number]  HPI Here to follow up a hospital stay at [facility] from 01-28-17 to 01-30-17 when she underwent a laparoscopic right hemicolectomy with a primary reanastomosis. 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 This was performed by Dr. Alfonso Patten. Several lymph nodes were also removed from the RLQ. Since the surgery she has done quite well. She is tolerating whole food well and her BMs are normal. No fevers. She is waiting to hear about an appt to meet the Oncology team soon. Of course the final pathology reports will help determine what type of chemotherapy she will need. She has only mild pains now. She can walk up and down steps but is not driving or lifting weights yet. She is also concerned about some other abnormalities seen on the preoperative CT scans that she wants to me to discuss with her. These included a 59mm subpleural nodule in the right upper lobe of the lungs, a tiny liver lesion that could not be characterized, and 2 tiny lesions in the kidneys that could not be characterized, one in the upper pole of the left kidney and one in the upper pole of the right kidney. When we compare this report to the report of a CT scan of the abdomen and pelvis we ordered in 2016, these lesions were not mentioned (but of course the scan in 2016 was without contrast).   Review of Systems  Constitutional: Negative.   Respiratory: Negative.   Cardiovascular: Negative.   Gastrointestinal: Positive for abdominal pain. Negative for abdominal distention, anal bleeding, blood in stool, constipation, diarrhea, nausea, rectal pain and vomiting.  Genitourinary: Negative.   Neurological: Negative.        Objective:   Physical Exam  Constitutional:  She appears well-developed and well-nourished. No distress.  Cardiovascular: Normal rate, regular rhythm, normal heart sounds and intact distal pulses.   Pulmonary/Chest: Effort normal and breath sounds normal.  Abdominal: Soft. Bowel sounds are normal. She exhibits no distension and no mass. There is no rebound and no guarding.  Mildly tender diffusely. 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 She is eating and drinking and stooling well. She is waiting on the final pathology report and on a call from the Oncology office at [facility] to begin further treatments. I encouraged her to maximize her nutrition by consuming several Ensure shakes a day, and she agreed. My first impression of these small lesions seen in the lungs and liver and kidneys is to not worry about them now. We can certainly set up an abdominal MRI to evaluate them further at some point. I asked her to ask her Oncologist to review these as well and to offer her some advice.  Alysia Penna, MD

## 2017-02-03 NOTE — Patient Instructions (Signed)
WE NOW OFFER   [facility] FAST TRACK!!!  SAME DAY Appointments for ACUTE CARE  Such as: Sprains, Injuries, cuts, abrasions, rashes, muscle pain, joint pain, back pain Colds, flu, sore throats, headache, allergies, cough, fever  Ear pain, sinus and eye infections Abdominal pain, nausea, vomiting, diarrhea, upset stomach Animal/insect bites  3 Easy Ways to Schedule: Walk-In Scheduling Call in scheduling Mychart Sign-up: [email or web address]         

## 2017-02-05 ENCOUNTER — Ambulatory Visit: Payer: Medicare Other | Admitting: Family Medicine

## 2017-02-13 ENCOUNTER — Ambulatory Visit: Payer: Medicare Other | Admitting: Family Medicine

## 2017-02-20 ENCOUNTER — Encounter: Payer: Self-pay | Admitting: Family Medicine

## 2017-02-20 ENCOUNTER — Ambulatory Visit (INDEPENDENT_AMBULATORY_CARE_PROVIDER_SITE_OTHER): Payer: Medicare Other | Admitting: Family Medicine

## 2017-02-20 VITALS — BP 102/80 | HR 66 | Temp 98.3°F | Wt 129.8 lb

## 2017-02-20 DIAGNOSIS — G8929 Other chronic pain: Secondary | ICD-10-CM | POA: Diagnosis not present

## 2017-02-20 DIAGNOSIS — K219 Gastro-esophageal reflux disease without esophagitis: Secondary | ICD-10-CM | POA: Diagnosis not present

## 2017-02-20 DIAGNOSIS — F411 Generalized anxiety disorder: Secondary | ICD-10-CM

## 2017-02-20 DIAGNOSIS — F4310 Post-traumatic stress disorder, unspecified: Secondary | ICD-10-CM

## 2017-02-20 DIAGNOSIS — R1013 Epigastric pain: Secondary | ICD-10-CM | POA: Diagnosis not present

## 2017-02-20 MED ORDER — LORAZEPAM 1 MG PO TABS: 1.0000 mg | ORAL_TABLET | Freq: Three times a day (TID) | ORAL | 1 refills | Status: DC | PRN

## 2017-02-20 NOTE — Progress Notes (Signed)
Pre visit review using our clinic review tool, if applicable. No additional management support is needed unless otherwise documented below in the visit note. 
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
## 2017-02-20 NOTE — Patient Instructions (Signed)
WE NOW OFFER   [facility] FAST TRACK!!!  SAME DAY Appointments for ACUTE CARE  Such as: Sprains, Injuries, cuts, abrasions, rashes, muscle pain, joint pain, back pain Colds, flu, sore throats, headache, allergies, cough, fever  Ear pain, sinus and eye infections Abdominal pain, nausea, vomiting, diarrhea, upset stomach Animal/insect bites  3 Easy Ways to Schedule: Walk-In Scheduling Call in scheduling Mychart Sign-up: [email or web address]         

## 2017-03-18 ENCOUNTER — Ambulatory Visit (INDEPENDENT_AMBULATORY_CARE_PROVIDER_SITE_OTHER): Payer: Medicare Other | Admitting: Family Medicine

## 2017-03-18 ENCOUNTER — Telehealth: Payer: Self-pay | Admitting: Family Medicine

## 2017-03-18 ENCOUNTER — Encounter: Payer: Self-pay | Admitting: Family Medicine

## 2017-03-18 VITALS — BP 127/88 | HR 64 | Temp 98.6°F | Ht 61.0 in | Wt 127.0 lb

## 2017-03-18 DIAGNOSIS — K5732 Diverticulitis of large intestine without perforation or abscess without bleeding: Secondary | ICD-10-CM

## 2017-03-18 DIAGNOSIS — R109 Unspecified abdominal pain: Secondary | ICD-10-CM | POA: Diagnosis not present

## 2017-03-18 LAB — POC URINALSYSI DIPSTICK (AUTOMATED)
Bilirubin, UA: NEGATIVE
Blood, UA: NEGATIVE
GLUCOSE UA: NEGATIVE
Ketones, UA: NEGATIVE
Leukocytes, UA: NEGATIVE
NITRITE UA: NEGATIVE
Protein, UA: NEGATIVE
Spec Grav, UA: >=1.03 — AB (ref 1.010–1.025)
UROBILINOGEN UA: 0.2 U/dL
pH, UA: 6 (ref 5.0–8.0)

## 2017-03-18 MED ORDER — CIPROFLOXACIN HCL 500 MG PO TABS: 500.0000 mg | ORAL_TABLET | Freq: Two times a day (BID) | ORAL | 0 refills | Status: DC

## 2017-03-18 MED ORDER — METRONIDAZOLE 500 MG PO TABS: 500.0000 mg | ORAL_TABLET | Freq: Three times a day (TID) | ORAL | 0 refills | Status: DC

## 2017-03-18 NOTE — Telephone Encounter (Signed)
I left a voice message with below information. 

## 2017-03-18 NOTE — Telephone Encounter (Signed)
I would prefer that she stay on Cipro and Metronidazole because they are the most likely medications to cure her diverticulitis. She could take something to counteract the diarrhea such as Imodium OTC or we can cal in Lomotil for this.

## 2017-03-18 NOTE — Patient Instructions (Signed)
WE NOW OFFER   [facility] FAST TRACK!!!  SAME DAY Appointments for ACUTE CARE  Such as: Sprains, Injuries, cuts, abrasions, rashes, muscle pain, joint pain, back pain Colds, flu, sore throats, headache, allergies, cough, fever  Ear pain, sinus and eye infections Abdominal pain, nausea, vomiting, diarrhea, upset stomach Animal/insect bites  3 Easy Ways to Schedule: Walk-In Scheduling Call in scheduling Mychart Sign-up: [email or web address]         

## 2017-03-18 NOTE — Progress Notes (Signed)
   Subjective:    Patient ID: Shelley Jones, female    DOB: 1951/04/10, 66 y.o.   MRN: [id number]  HPI Here to check on some lower abdominal pains she has had for the past few days. Her BMs have been erratic and they vary from constipated to diarrhea. For the past 2 days she has had only diarrhea. No fever. No urinary symptoms. She recently had a port placed for her to receive chemotherapy for the colon cancer. Her main Oncologist at [facility] is Dr. Shirleen Schirmer. Shelley Jones is scheduled to get the next round of chemotherapy tomorrow. Of note on a CBC they obtained on 5-17 her WBC had jumped up to 22.4 with no apparent infections. She has had several bouts of diverticulitis in the past few years.    Review of Systems  Constitutional: Negative.   Respiratory: Negative.   Cardiovascular: Negative.   Gastrointestinal: Positive for abdominal distention, abdominal pain, diarrhea and nausea. Negative for anal bleeding, blood in stool, constipation, rectal pain and vomiting.  Genitourinary: Negative.        Objective:   Physical Exam  Constitutional: She appears well-developed and well-nourished. No distress.  Cardiovascular: Normal rate, regular rhythm, normal heart sounds and intact distal pulses.   Pulmonary/Chest: Effort normal and breath sounds normal.  Abdominal: Soft. Bowel sounds are normal. She exhibits no distension. There is no rebound and no guarding.  She is moderately tender in the left flank and LLQ          Assessment & Plan:  This is another bout of diverticulitis. We will treat it with Cipro and Flagyl. I asked to see her again for follow up early next week. She will contact Dr. Altamease Oiler today to tell them what is going on because they will likely postpone the chemotherapy for awhile.  Alysia Penna, MD

## 2017-03-18 NOTE — Addendum Note (Signed)
Addended by: Aggie Hacker A on: 03/18/2017 11:12 AM   Modules accepted: Orders

## 2017-03-18 NOTE — Telephone Encounter (Signed)
Pt state that she is not able to take metronidazole it gives her extra diarrhea that she does not need before her appointment but she state that if she can get something ng in penicillin family for her diverticulitis.  Pt state that she has a appointment in the morning 9:00 in [facility] and would like to have the pain to subside before that appt.

## 2017-04-20 IMAGING — CT CT ABD-PELV W/O CM
2 of 4 series · 17 of 46 positions shown, 19 images · non-contrast
Comparison: 05/08/2005

CLINICAL DATA: [DATE] week hx of upper abd pain, nausea and belching.
Neg abd US. Hx of diverticulitis. Oral only due to allergy to IV
contrast. Unable to take pre meds due to difficulty taking
prednisone.

EXAM:
CT ABDOMEN AND PELVIS WITHOUT CONTRAST
TECHNIQUE: Multidetector CT imaging of the abdomen and pelvis was performed
following the standard protocol without IV contrast.

[Series 2: ap without · axial · non-contrast · 0.65mm/px · z∈[-384,-20]mm · 14 of 81 slices shown, 16 images]
[im 4/81  soft-tissue]
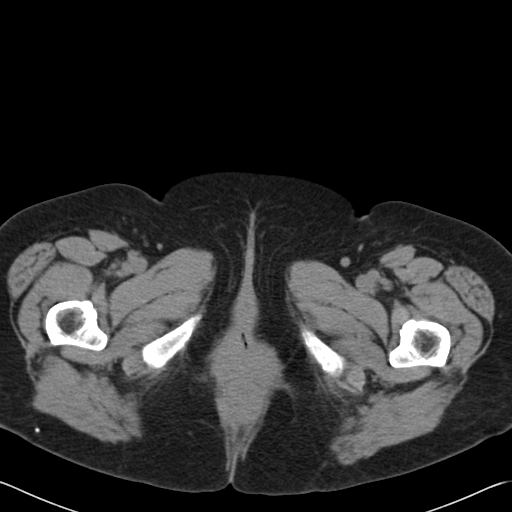
[im 4/81  bone]
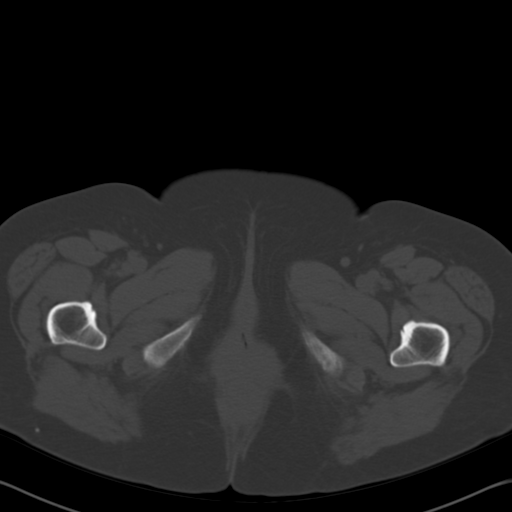
[im 11/81  soft-tissue]
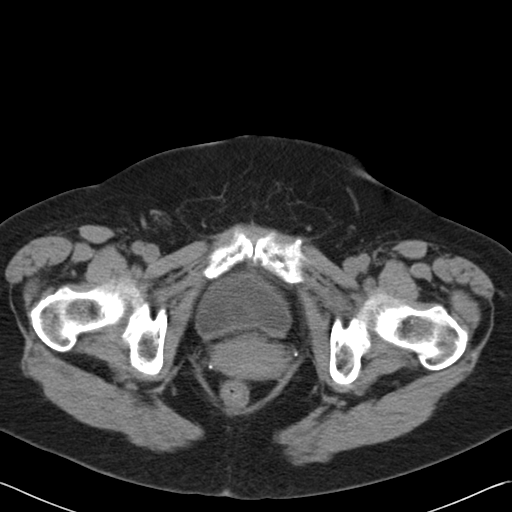
[im 17/81  soft-tissue]
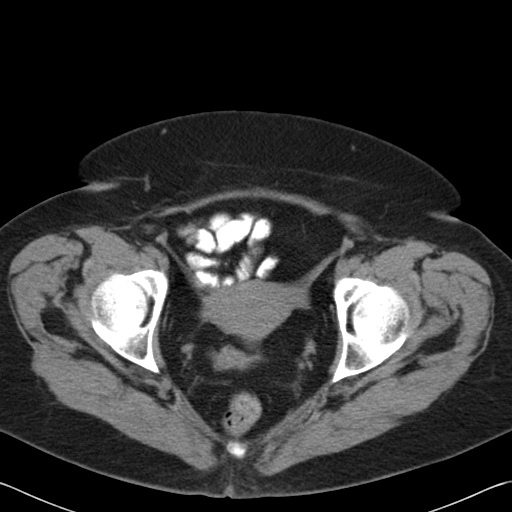
[im 21/81  soft-tissue]
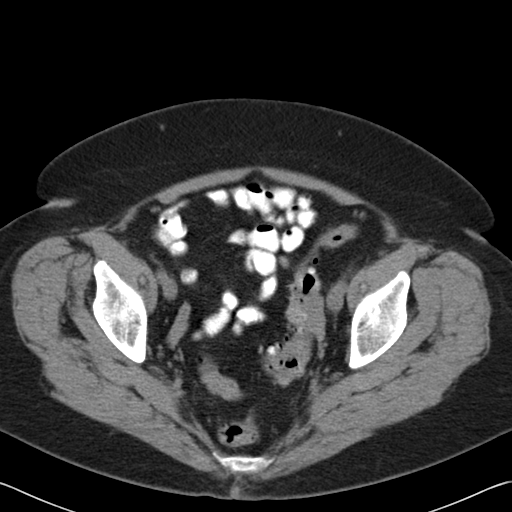
[im 27/81  soft-tissue]
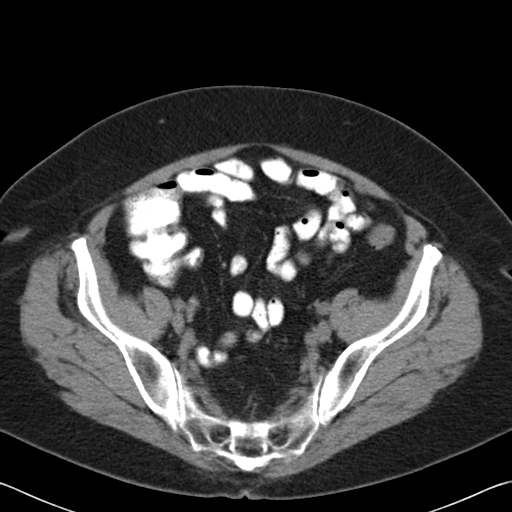
[im 34/81  soft-tissue]
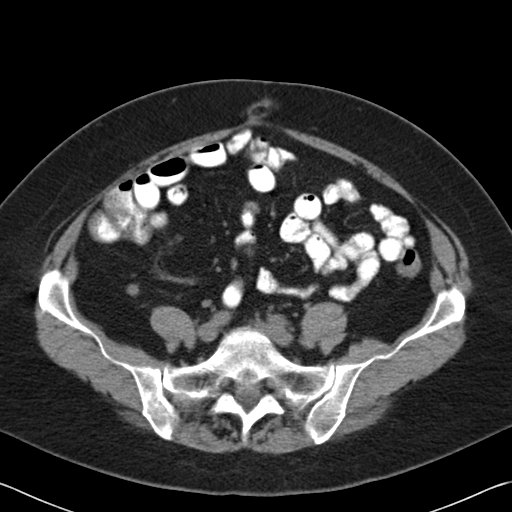
[im 37/81  soft-tissue]
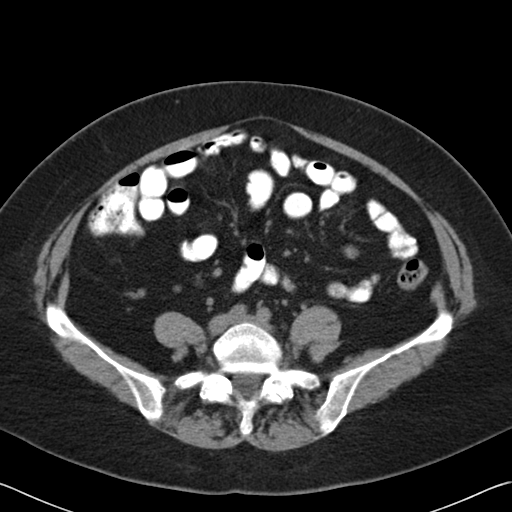
[im 44/81  soft-tissue]
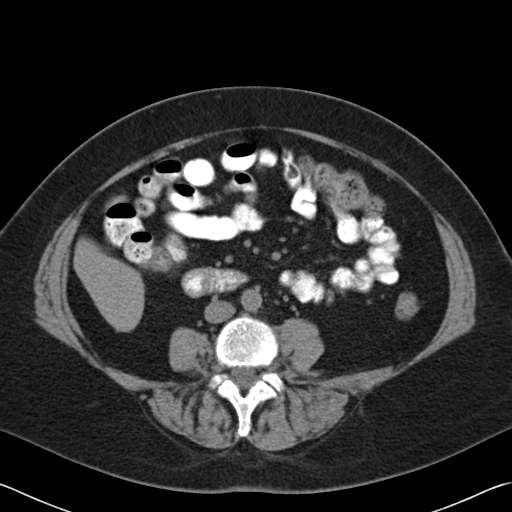
[im 47/81  soft-tissue]
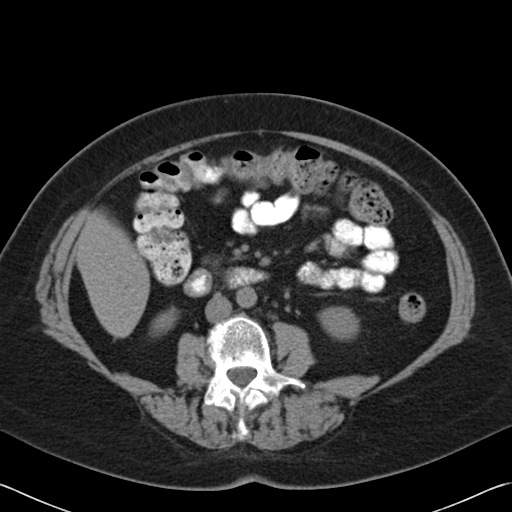
[im 47/81  bone]
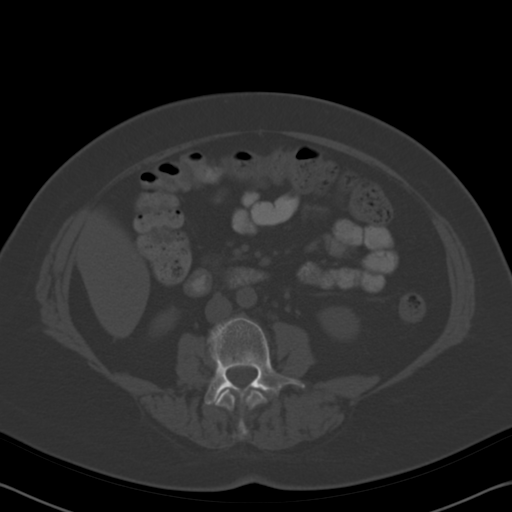
[im 54/81  soft-tissue]
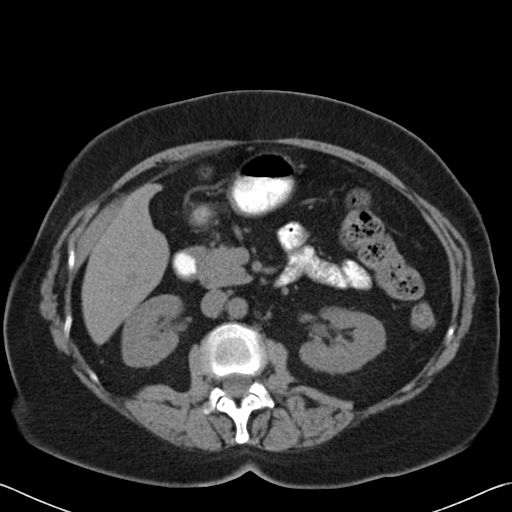
[im 61/81  soft-tissue]
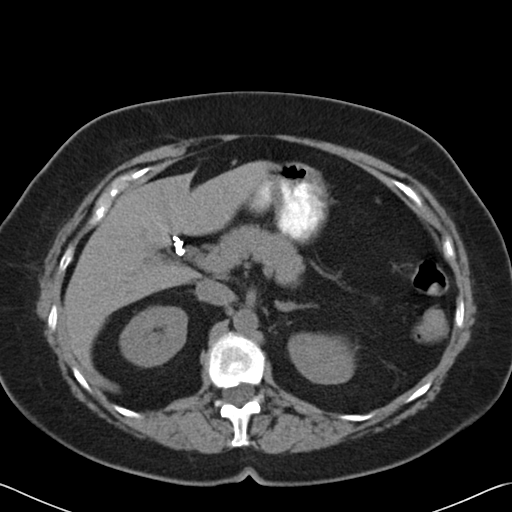
[im 64/81  soft-tissue]
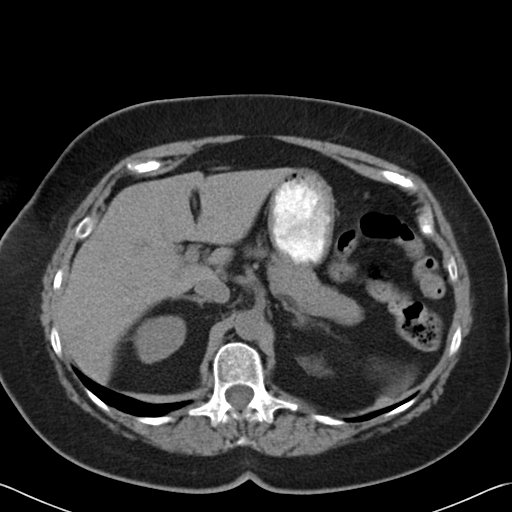
[im 71/81  soft-tissue]
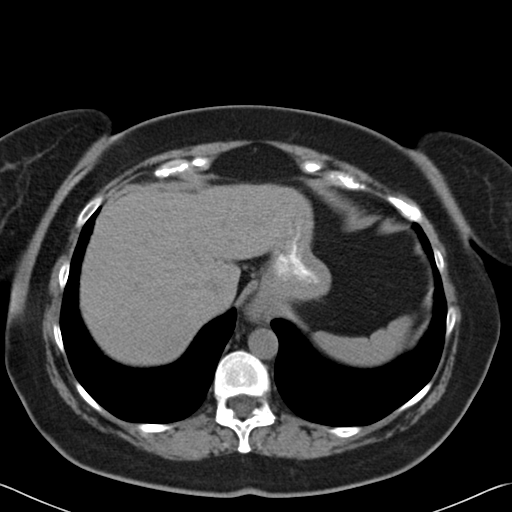
[im 77/81  soft-tissue]
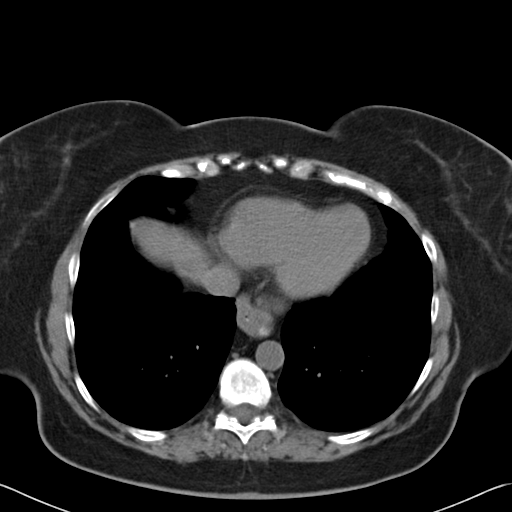

[Series 602: cor · coronal · 0.82mm/px · 3 of 113 slices shown]
[im 38/113  soft-tissue]
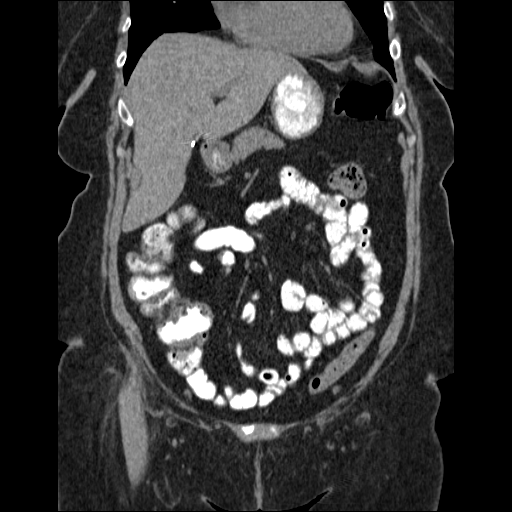
[im 50/113  soft-tissue]
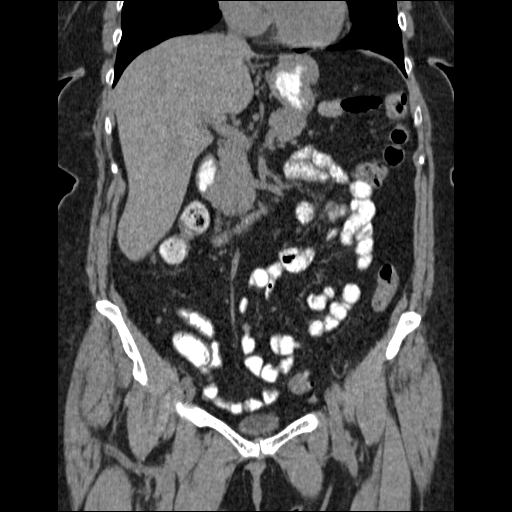
[im 63/113  soft-tissue]
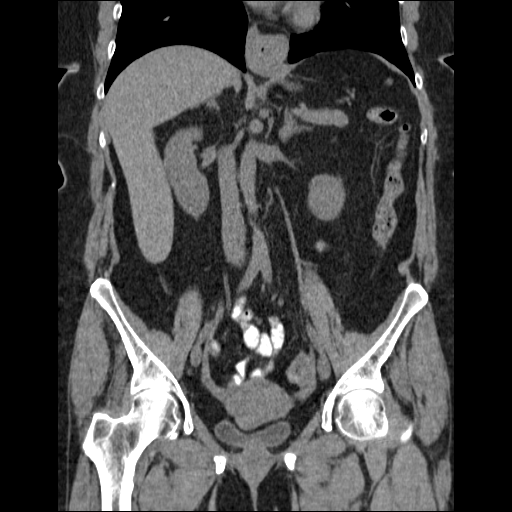

[17 of 46 positions shown; findings below may reference images not displayed]

FINDINGS: Lung bases: Minor scarring and/ or atelectasis at the anterior lung
bases. Heart normal in size.

Liver, spleen, pancreas, adrenal glands:  Unremarkable.

Gallbladder and biliary tree. Status post cholecystectomy. No bile
duct dilation.

Kidneys, ureters, bladder: Small low-density lesion from the
posterior lower pole of the right kidney measuring 1 cm, mildly
increased in size the prior study, consistent with a cyst. No other
renal masses, no stones and no hydronephrosis. Normal ureters.
Bladder is unremarkable.

Uterus and adnexa:  Unremarkable.

Lymph nodes:  No enlarged or abnormal lymph nodes.

Ascites:  None.

GI tract: Small hiatal hernia. Scattered colonic diverticula mostly
along the sigmoid colon. No evidence of diverticulitis. Colon
otherwise unremarkable. Normal small bowel. Normal appendix.

Musculoskeletal: Degenerative changes of the lumbar spine most
evident at L3-L4. No osteoblastic or osteolytic lesions.
IMPRESSION: 1. No acute findings.
2. Sigmoid colon diverticula without evidence of diverticulitis.

## 2017-04-27 ENCOUNTER — Other Ambulatory Visit: Payer: Self-pay | Admitting: Family Medicine

## 2017-04-27 NOTE — Telephone Encounter (Signed)
Call in #[phone] with 5 rf 

## 2017-04-28 NOTE — Telephone Encounter (Signed)
Script for Ativan was printed and picked up today by patient.

## 2017-04-28 NOTE — Telephone Encounter (Signed)
Drug allergy alert to Midazolam, can we refill?

## 2017-04-28 NOTE — Telephone Encounter (Signed)
Yes, ok to refill 

## 2017-05-20 ENCOUNTER — Encounter: Payer: Self-pay | Admitting: Family Medicine

## 2017-05-20 ENCOUNTER — Ambulatory Visit (INDEPENDENT_AMBULATORY_CARE_PROVIDER_SITE_OTHER): Payer: Medicare Other | Admitting: Family Medicine

## 2017-05-20 VITALS — BP 124/82 | Temp 98.6°F | Ht 61.0 in | Wt 131.0 lb

## 2017-05-20 DIAGNOSIS — K5792 Diverticulitis of intestine, part unspecified, without perforation or abscess without bleeding: Secondary | ICD-10-CM

## 2017-05-20 NOTE — Patient Instructions (Signed)
WE NOW OFFER   [facility] FAST TRACK!!!  SAME DAY Appointments for ACUTE CARE  Such as: Sprains, Injuries, cuts, abrasions, rashes, muscle pain, joint pain, back pain Colds, flu, sore throats, headache, allergies, cough, fever  Ear pain, sinus and eye infections Abdominal pain, nausea, vomiting, diarrhea, upset stomach Animal/insect bites  3 Easy Ways to Schedule: Walk-In Scheduling Call in scheduling Mychart Sign-up: [email or web address]         

## 2017-05-20 NOTE — Progress Notes (Signed)
   Subjective:    Patient ID: Shelley Jones, female    DOB: [birth year], 66 y.o.   MRN: [id number]  HPI Here for 5 days of LLQ pain and nausea. No fever. She has had frequent bouts of diverticulitis and she felt this was another such bout. She had some Cipro and Flagyl at home so she started taking both of these. Now the pain is much better but she is very nauseated. Her stools are loose but not watery. She thinks the antibiotics are the cause of the nausea. No urinary symptoms.   Review of Systems  Constitutional: Negative.   Respiratory: Negative.   Cardiovascular: Negative.   Gastrointestinal: Positive for abdominal pain and nausea. Negative for abdominal distention, anal bleeding, blood in stool, constipation and vomiting.  Genitourinary: Negative.        Objective:   Physical Exam  Constitutional: She appears well-developed and well-nourished. No distress.  Cardiovascular: Normal rate, regular rhythm, normal heart sounds and intact distal pulses.   Pulmonary/Chest: Effort normal and breath sounds normal. No respiratory distress. She has no wheezes. She has no rales.  Abdominal: Soft. Bowel sounds are normal. She exhibits no distension and no mass. There is no rebound and no guarding.  Mildly tender in the LLQ           Assessment & Plan:  Partially treated diverticulitis. She will take 5 more days of Cipro but she will stop the Flagyl. I agree her nausea may in part be side effects. Recheck prn.  Alysia Penna, MD

## 2017-09-04 ENCOUNTER — Telehealth: Payer: Self-pay | Admitting: Family Medicine

## 2017-09-04 NOTE — Telephone Encounter (Signed)
Pt advised and voiced understanding.   

## 2017-09-04 NOTE — Telephone Encounter (Signed)
She can continue to take this for now. We can discuss tapering in the future

## 2017-09-04 NOTE — Telephone Encounter (Signed)
Pt advised that pharmacy plans to refill her Rx for her pt had refills left NO early refill request was needed. Pt stated that she thought she thought Dr. Sarajane Jews wanted here to taper of the Lorazepam if so how should she taper off this medication? Sent to PCP should pt taper off Lorazepam or is it OK for her to continue?

## 2017-09-04 NOTE — Telephone Encounter (Signed)
Call her pharmacy to ask if they can give her an early refill

## 2017-09-04 NOTE — Telephone Encounter (Signed)
I called the pharmacy and made them aware that pt listed her current Rx bottle and the provider is giving the OK for an early refill. When speaking to the pharmacist they stated that the pt is actually due for a refill. Called pt and left to call back. CRM created.

## 2017-09-04 NOTE — Telephone Encounter (Signed)
Copied from [place] [id number] [id number]. Topic: Quick Communication - See Telephone Encounter >> Sep 04, 2017 11:23 AM Boyd Kerbs wrote: CRM for notification. See Telephone encounter for: patient went to [facility] yesterday for her follow up [facility] treatment and took her bottle of Lorazepam to showed the doctor what she was taking. She stopped to eat on way home. When she got home she did not have the bottle. You did call the doctors office and the restaurant, checked her car and around her house. She can not find it anywhere. Patient is panicking about it, She is scared of just not taking it and stopping cold [place] and what effects it could have. Also, someone could have picked it up and doesn't know if someone would take some. Please call patient and let her know what to do.   09/04/17.

## 2017-09-29 ENCOUNTER — Ambulatory Visit: Payer: Self-pay | Admitting: *Deleted

## 2017-09-29 NOTE — Telephone Encounter (Signed)
Patient rescheduled to 11:15am. Pt aware. Nothing further needed at this time.

## 2017-09-29 NOTE — Telephone Encounter (Signed)
Patient is concerned about her symptoms. Patient advised to call 911 if her symptoms should get worse- although she has had them for a long time.  Reason for Disposition . Age > 60 years (Exception: brief heart beat symptoms that went away and now feels well)  Answer Assessment - Initial Assessment Questions 1. DESCRIPTION: "Please describe your heart rate or heart beat that you are having" (e.g., fast/slow, regular/irregular, skipped or extra beats, "palpitations")     Increase pulse rate- vibration feeling in arms,stomach, torso- fast- extra beats 2. ONSET: "When did it start?" (Minutes, hours or days)      Started in August- but it has gotten nightly- multiple time now 3. DURATION: "How long does it last" (e.g., seconds, minutes, hours)     Couple minutes- patient massages chest and arms and it gets better- may restart a couple hours later 4. PATTERN "Does it come and go, or has it been constant since it started?"  "Does it get worse with exertion?"   "Are you feeling it now?"     Comes and goes- happening at night- it wakes her up 5. TAP: "Using your hand, can you tap out what you are feeling on a chair or table in front of you, so that I can hear?" (Note: not all patients can do this)       Patient has not noticed it during the day 6. HEART RATE: "Can you tell me your heart rate?" "How many beats in 15 seconds?"  (Note: not all patients can do this)       20 7. RECURRENT SYMPTOM: "Have you ever had this before?" If so, ask: "When was the last time?" and "What happened that time?"      It has been going on - but getting worse- last night- it happens every night 8. CAUSE: "What do you think is causing the palpitations?"     Possible reaction to chemo drug 9. CARDIAC HISTORY: "Do you have any history of heart disease?" (e.g., heart attack, angina, bypass surgery, angioplasty, arrhythmia)      no 10. OTHER SYMPTOMS: "Do you have any other symptoms?" (e.g., dizziness, chest pain, sweating,  difficulty breathing)       Some dizziness- chemo- patient is also using antidepressants  11. PREGNANCY: "Is there any chance you are pregnant?" "When was your last menstrual period?"       n/a  Protocols used: HEART RATE AND HEARTBEAT QUESTIONS-A-AH

## 2017-09-30 ENCOUNTER — Ambulatory Visit: Payer: Medicare Other | Admitting: Family Medicine

## 2017-09-30 ENCOUNTER — Encounter: Payer: Self-pay | Admitting: Family Medicine

## 2017-09-30 VITALS — BP 120/80 | HR 67 | Temp 97.5°F | Wt 137.2 lb

## 2017-09-30 DIAGNOSIS — I499 Cardiac arrhythmia, unspecified: Secondary | ICD-10-CM

## 2017-09-30 DIAGNOSIS — G629 Polyneuropathy, unspecified: Secondary | ICD-10-CM | POA: Insufficient documentation

## 2017-09-30 DIAGNOSIS — C189 Malignant neoplasm of colon, unspecified: Secondary | ICD-10-CM | POA: Diagnosis not present

## 2017-09-30 LAB — VITAMIN B12: Vitamin B-12: 572 pg/mL (ref 211–911)

## 2017-09-30 MED ORDER — SERTRALINE HCL 25 MG PO TABS: 25.0000 mg | ORAL_TABLET | Freq: Every day | ORAL | 0 refills | Status: DC

## 2017-09-30 MED ORDER — GABAPENTIN 100 MG PO CAPS: 100.0000 mg | ORAL_CAPSULE | Freq: Three times a day (TID) | ORAL | 3 refills | Status: AC

## 2017-09-30 NOTE — Progress Notes (Signed)
   Subjective:    Patient ID: Shelley Jones, female    DOB: [birth year], 66 y.o.   MRN: [id number]  HPI Here for several issues. First she has developed tingling, burning, and numbness in both feet and both hands over the past few months. She is getting chemotherapy through the [facility] clinic, and this is certainly the cause of this. They suggested she have her B12 level checked. Also she has experienced intermittent skipping or fluttering in the chest at night when she is lying in bed. No chest pain or SOB. She never notices this in the daytime.    Review of Systems  Constitutional: Negative.   Respiratory: Negative.   Cardiovascular: Positive for palpitations. Negative for chest pain and leg swelling.  Neurological: Positive for numbness. Negative for weakness.       Objective:   Physical Exam  Constitutional: She is oriented to person, place, and time. She appears well-developed and well-nourished.  Cardiovascular: Normal rate, regular rhythm, normal heart sounds and intact distal pulses.  EKG is within normal limits   Pulmonary/Chest: Effort normal and breath sounds normal. No respiratory distress. She has no wheezes. She has no rales.  Neurological: She is alert and oriented to person, place, and time.          Assessment & Plan:  She probably has some intermittent ectopy that she only notices when lying still in bed. This is almost always benign and I reassured her. She will return if anything else occurs. She has peripheral neuropathy and chemotherapy is the most likely etiology. We will check a B12 level today. For the neuropathy I advised her to take a B complex vitamin daily and she will try Gabapentin at 100 mg tid. Recheck prn. Alysia Penna, MD

## 2017-10-05 ENCOUNTER — Ambulatory Visit: Payer: Medicare Other | Admitting: Family Medicine

## 2017-10-30 ENCOUNTER — Telehealth: Payer: Self-pay | Admitting: Family Medicine

## 2017-10-30 MED ORDER — SERTRALINE HCL 25 MG PO TABS: 25.0000 mg | ORAL_TABLET | Freq: Every day | ORAL | 0 refills | Status: AC

## 2017-10-30 NOTE — Telephone Encounter (Signed)
Sent to PCP for approval.  

## 2017-10-30 NOTE — Telephone Encounter (Signed)
Please call in #7 with no rf

## 2017-10-30 NOTE — Telephone Encounter (Signed)
Copied from [facility]. Topic: Quick Communication - See Telephone Encounter >> Oct 30, 2017 11:47 AM Aurelio Brash B wrote: CRM for notification. See Telephone encounter for:  PT lost her sertraline (ZOLOFT) 25 MG tablet and is asking if she can  get 6-7  pills until she can get her next refill    [id number] [address], [place] [phone] (Phone) [phone] (Fax)   10/30/17.

## 2017-11-04 ENCOUNTER — Ambulatory Visit: Payer: Medicare Other | Admitting: Family Medicine

## 2017-11-04 ENCOUNTER — Encounter: Payer: Self-pay | Admitting: Family Medicine

## 2017-11-04 VITALS — BP 120/77 | HR 82 | Temp 98.5°F | Resp 12 | Ht 61.0 in | Wt 133.1 lb

## 2017-11-04 DIAGNOSIS — R109 Unspecified abdominal pain: Secondary | ICD-10-CM | POA: Diagnosis not present

## 2017-11-04 DIAGNOSIS — K219 Gastro-esophageal reflux disease without esophagitis: Secondary | ICD-10-CM | POA: Diagnosis not present

## 2017-11-04 DIAGNOSIS — R197 Diarrhea, unspecified: Secondary | ICD-10-CM | POA: Diagnosis not present

## 2017-11-04 NOTE — Patient Instructions (Addendum)
  [name] I have seen you today for an acute visit.  A few things to remember from today's visit:   Gastroesophageal reflux disease, esophagitis presence not specified  Abdominal pain, unspecified abdominal location - Plan: CBC with Differential/Platelet, Basic metabolic panel, CT Abdomen Pelvis W Contrast, Urinalysis, Routine w reflex microscopic   Medications prescribed today are intended for short period of time and will not be refill upon request, a follow up appointment might be necessary to discuss continuation of of treatment if appropriate.   Start omeprazole 40 mg, continue ranitidine. Soft/liquid diet.  CT will be arranged for tomorrow morning. Labs today.     Abdominal Pain, Adult Many things can cause belly (abdominal) pain. Most times, belly pain is not dangerous. Many cases of belly pain can be watched and treated at home. Sometimes belly pain is serious, though. Your doctor will try to find the cause of your belly pain. Follow these instructions at home:  Take over-the-counter and prescription medicines only as told by your doctor. Do not take medicines that help you poop (laxatives) unless told to by your doctor.  Drink enough fluid to keep your pee (urine) clear or pale yellow.  Watch your belly pain for any changes.  Keep all follow-up visits as told by your doctor. This is important. Contact a doctor if:  Your belly pain changes or gets worse.  You are not hungry, or you lose weight without trying.  You are having trouble pooping (constipated) or have watery poop (diarrhea) for more than 2-3 days.  You have pain when you pee or poop.  Your belly pain wakes you up at night.  Your pain gets worse with meals, after eating, or with certain foods.  You are throwing up and cannot keep anything down.  You have a fever. Get help right away if:  Your pain does not go away as soon as your doctor says it should.  You cannot stop throwing  up.  Your pain is only in areas of your belly, such as the right side or the left lower part of the belly.  You have bloody or black poop, or poop that looks like tar.  You have very bad pain, cramping, or bloating in your belly.  You have signs of not having enough fluid or water in your body (dehydration), such as: ? Dark pee, very little pee, or no pee. ? Cracked lips. ? Dry mouth. ? Sunken eyes. ? Sleepiness. ? Weakness. This information is not intended to replace advice given to you by your health care provider. Make sure you discuss any questions you have with your health care provider. Document Released: 03/24/2008 Document Revised: 04/25/2016 Document Reviewed: 03/19/2016 Elsevier Interactive Patient Education  2018 [employer].  In general please monitor for signs of worsening symptoms and seek immediate medical attention if any concerning.   I hope you get better soon!
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
## 2017-11-05 ENCOUNTER — Ambulatory Visit: Payer: Medicare Other | Admitting: Family Medicine

## 2017-11-05 ENCOUNTER — Ambulatory Visit
Admission: RE | Admit: 2017-11-05 | Discharge: 2017-11-05 | Disposition: A | Discharge status: Home / Self Care | Payer: Medicare Other | Source: Ambulatory Visit | Attending: Internal Medicine | Admitting: Internal Medicine

## 2017-11-05 ENCOUNTER — Other Ambulatory Visit: Payer: Self-pay

## 2017-11-05 DIAGNOSIS — R918 Other nonspecific abnormal finding of lung field: Secondary | ICD-10-CM | POA: Diagnosis not present

## 2017-11-05 DIAGNOSIS — R1031 Right lower quadrant pain: Secondary | ICD-10-CM

## 2017-11-05 DIAGNOSIS — Z9049 Acquired absence of other specified parts of digestive tract: Secondary | ICD-10-CM | POA: Diagnosis not present

## 2017-11-05 DIAGNOSIS — R590 Localized enlarged lymph nodes: Secondary | ICD-10-CM | POA: Insufficient documentation

## 2017-11-05 DIAGNOSIS — C787 Secondary malignant neoplasm of liver and intrahepatic bile duct: Secondary | ICD-10-CM | POA: Diagnosis not present

## 2017-11-05 LAB — CBC WITH DIFFERENTIAL/PLATELET
BASOS PCT: 1.1 % (ref 0.0–3.0)
Basophils Absolute: 0.2 10*3/uL — ABNORMAL HIGH (ref 0.0–0.1)
EOS PCT: 1.2 % (ref 0.0–5.0)
Eosinophils Absolute: 0.3 10*3/uL (ref 0.0–0.7)
HCT: 43.5 % (ref 36.0–46.0)
HEMOGLOBIN: 13.9 g/dL (ref 12.0–15.0)
Lymphocytes Relative: 8.5 % — ABNORMAL LOW (ref 12.0–46.0)
Lymphs Abs: 1.7 10*3/uL (ref 0.7–4.0)
MCHC: 32.1 g/dL (ref 30.0–36.0)
MCV: 98.4 fl (ref 78.0–100.0)
MONO ABS: 1.9 10*3/uL — AB (ref 0.1–1.0)
Monocytes Relative: 9.3 % (ref 3.0–12.0)
Neutro Abs: 16.2 10*3/uL — ABNORMAL HIGH (ref 1.4–7.7)
Neutrophils Relative %: 79.9 % — ABNORMAL HIGH (ref 43.0–77.0)
Platelets: 322 10*3/uL (ref 150.0–400.0)
RBC: 4.42 Mil/uL (ref 3.87–5.11)
RDW: 13.1 % (ref 11.5–15.5)

## 2017-11-05 LAB — COMPREHENSIVE METABOLIC PANEL
ALT: 32 U/L (ref 0–35)
AST: 60 U/L — AB (ref 0–37)
Albumin: 4.2 g/dL (ref 3.5–5.2)
Alkaline Phosphatase: 269 U/L — ABNORMAL HIGH (ref 39–117)
BILIRUBIN TOTAL: 0.7 mg/dL (ref 0.2–1.2)
BUN: 22 mg/dL (ref 6–23)
CALCIUM: 9.9 mg/dL (ref 8.4–10.5)
CO2: 26 mEq/L (ref 19–32)
CREATININE: 1.16 mg/dL (ref 0.40–1.20)
Chloride: 102 mEq/L (ref 96–112)
GFR: 49.69 mL/min — ABNORMAL LOW (ref >60.00)
GLUCOSE: 95 mg/dL (ref 70–99)
Potassium: 5 mEq/L (ref 3.5–5.1)
Sodium: 138 mEq/L (ref 135–145)
Total Protein: 7.1 g/dL (ref 6.0–8.3)

## 2017-11-05 LAB — URINALYSIS, ROUTINE W REFLEX MICROSCOPIC
Bilirubin Urine: NEGATIVE
Hgb urine dipstick: NEGATIVE
Ketones, ur: NEGATIVE
Nitrite: NEGATIVE
RBC / HPF: NONE SEEN (ref 0–?)
SPECIFIC GRAVITY, URINE: 1.025 (ref 1.000–1.030)
Total Protein, Urine: NEGATIVE
URINE GLUCOSE: NEGATIVE
Urobilinogen, UA: 0.2 (ref 0.0–1.0)
pH: 5.5 (ref 5.0–8.0)

## 2017-11-05 NOTE — Progress Notes (Signed)
Ct abd.pel

## 2017-11-06 ENCOUNTER — Telehealth: Payer: Self-pay

## 2017-11-06 ENCOUNTER — Other Ambulatory Visit: Payer: Self-pay | Admitting: Family Medicine

## 2017-11-06 ENCOUNTER — Telehealth: Payer: Self-pay | Admitting: Family Medicine

## 2017-11-06 DIAGNOSIS — D72829 Elevated white blood cell count, unspecified: Secondary | ICD-10-CM

## 2017-11-06 NOTE — Telephone Encounter (Signed)
Might be going to ER

## 2017-11-06 NOTE — Telephone Encounter (Signed)
Noted  

## 2017-11-06 NOTE — Telephone Encounter (Signed)
Patient checking status, trying to leave town. Please advise

## 2017-11-06 NOTE — Telephone Encounter (Signed)
Pt wanted Dr. Sarajane Jews to know that she is not feeling well at all and she last saw Dr. Martinique who seems concern after reviewing the pt's lab results. Pt is concern that she might be septus. Just wanted Dr. Sarajane Jews to know.   FYI

## 2017-11-06 NOTE — Telephone Encounter (Signed)
I spoke with Ms Pennie and then with her daughter to dicussed abdominal CT and CBC results. I explained that I do not feel like CBC abnormalities are related to an infectious process.    She did not tolerate recent antibiotic treatment well, so I do not want to start oral antibiotics again and she does not feel like she needs to go to the ER.  She is not longer having LLQ pain.  RUQ and RLQ abdominal pain, constant achy and intermittent sharp pain, otherwise stable. + Fullness sensation. Nausea this morning and resolved now. Has not noted urinary symptoms. Diarrhea has resolved.  She has no noted fever or chills.  According to the daughter, Ms. Bellina met with her oncologist yesterday, abdominal CT findings were discussed and she was given a poor prognosis.  According to the daughter, it was a discussion about possibility of having a liver biopsy in order to start another type of chemotherapy, which will be stronger and with more side effects with no significant benefits in regard to mortality. Ms Bass is not interested in having liver Bx but would like to explore other options.  We decided to repeat CBC Monday. She is not interested in medication for pain management. Increase fluid intake. She ans her daughter voice understanding and agree with plan.  Betty Martinique, MD

## 2017-11-06 NOTE — Telephone Encounter (Signed)
Have set up for labs to be mailed to the pt. Pt advised.

## 2017-11-06 NOTE — Telephone Encounter (Signed)
Call in #90 with 5 rf. Tell her that it is very common for this to lose some effectiveness over time

## 2017-11-06 NOTE — Telephone Encounter (Signed)
Last OV 11/04/2017 with Dr Martinique and last seen by Dr. Sarajane Jews 09/30/2017   Rx was last refilled 04/28/2017 disp 90 with 5 refills.   Sent to PCP for approval.   Pt wanted to know if some people experience that the medication starts to not work as well over time?   Pt stated that she would like a refill but might need to set up an OV to discuss other options of medication to help her.

## 2017-11-06 NOTE — Telephone Encounter (Signed)
Copied from [place] [id number]. Topic: Quick Communication - Lab Results >> Nov 06, 2017 10:25 AM Synthia Innocent wrote: Requesting lab results be mailed to her house

## 2017-11-06 NOTE — Telephone Encounter (Signed)
Pt advised and voiced understanding.   

## 2017-11-09 ENCOUNTER — Encounter: Payer: Self-pay | Admitting: Family Medicine

## 2017-11-09 ENCOUNTER — Ambulatory Visit: Payer: Medicare Other | Admitting: Family Medicine

## 2017-11-09 ENCOUNTER — Other Ambulatory Visit
Admission: RE | Admit: 2017-11-09 | Discharge: 2017-11-09 | Disposition: A | Discharge status: Home / Self Care | Payer: Medicare Other | Source: Ambulatory Visit | Attending: Family Medicine | Admitting: Family Medicine

## 2017-11-09 VITALS — BP 108/70 | HR 78 | Temp 98.0°F | Wt 133.2 lb

## 2017-11-09 DIAGNOSIS — C182 Malignant neoplasm of ascending colon: Secondary | ICD-10-CM | POA: Diagnosis not present

## 2017-11-09 DIAGNOSIS — D72829 Elevated white blood cell count, unspecified: Secondary | ICD-10-CM

## 2017-11-09 DIAGNOSIS — N390 Urinary tract infection, site not specified: Secondary | ICD-10-CM | POA: Diagnosis not present

## 2017-11-09 LAB — CBC WITH DIFFERENTIAL/PLATELET
Basophils Absolute: 0.1 10*3/uL (ref 0–0.1)
Basophils Relative: 1 %
EOS ABS: 0.4 10*3/uL (ref 0–0.7)
EOS PCT: 2 %
HCT: 41.5 % (ref 35.0–47.0)
Hemoglobin: 13.5 g/dL (ref 12.0–16.0)
LYMPHS ABS: 1.8 10*3/uL (ref 1.0–3.6)
Lymphocytes Relative: 8 %
MCH: 31.3 pg (ref 26.0–34.0)
MCHC: 32.6 g/dL (ref 32.0–36.0)
MCV: 96 fL (ref 80.0–100.0)
MONO ABS: 1.8 10*3/uL — AB (ref 0.2–0.9)
MONOS PCT: 8 %
Neutro Abs: 17.5 10*3/uL — ABNORMAL HIGH (ref 1.4–6.5)
Neutrophils Relative %: 81 %
PLATELETS: 277 10*3/uL (ref 150–440)
RBC: 4.33 MIL/uL (ref 3.80–5.20)
RDW: 13 % (ref 11.5–14.5)
WBC: 21.5 10*3/uL — ABNORMAL HIGH (ref 3.6–11.0)

## 2017-11-09 NOTE — Progress Notes (Signed)
   Subjective:    Patient ID: Shelley Jones, female    DOB: [birth year], 67 y.o.   MRN: [id number]  HPI Here with her daughter Mickel Baas to follow up an ER visit on 11-07-17 for abdominal pain. She has had a complicated week or two as far as health issues. She was seen her on 11-04-17 for lower abdominal pains that had been going on for several days. At first the pain was centered in the LLQ and she thought she was having another bout of diverticulitis. She was placed on Cipro and Flagyl, which she has taken in the past. She had no fever that day, but labs revealed an elevated WBC to 20.4. 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 We contact her Oncologist at [facility], Dr. Shirleen Schirmer, and they were able to see Miabella the next day. They are planning another RLQ percutaneous biopsy to get a tissue analysis of this mass. Then a few days later she developed a more generalized abdominal pain and a lower back pain. Her BMs are regular and urinations are regular. She then went to a [facility] urgent care on 11-07-17. That day a UA was positive and she was felt to have a UTI. This could explain the elevated WBC as well. She was started on Cefdinir, and she was told to stop the Cipro and Flagyl. She has taken only 2 doses of the Cefdinir so far, last night and again this morning. Drinking fluids. Today she feels much better and the abdominal pain has almost completely resolved. No N or V or fever. A urine culture showed only mixed urogenital flora.    Review of Systems  Constitutional: Negative.   Respiratory: Negative.   Cardiovascular: Negative.   Gastrointestinal: Positive for abdominal pain. Negative for abdominal distention, anal bleeding, blood in stool, constipation, diarrhea, nausea, rectal pain and vomiting.  Genitourinary: Negative.     Musculoskeletal: Positive for back pain.       Objective:   Physical Exam  Constitutional: She is oriented to person, place, and time. She appears well-developed and well-nourished. No distress.  Cardiovascular: Normal rate, regular rhythm, normal heart sounds and intact distal pulses.  Pulmonary/Chest: Effort normal and breath sounds normal. No respiratory distress. She has no wheezes. She has no rales.  Abdominal: Soft. Bowel sounds are normal. She exhibits no distension and no mass. There is no tenderness. There is no rebound and no guarding.  Neurological: She is alert and oriented to person, place, and time.          Assessment & Plan:  She has a UTI which seems to be responding to Cefdinir. Her urinary symptoms are better and a UA today is clear. She will follow up with her Oncologist next week and she has another biopsy coming up soon to check on the cancer recurrence. I reminded her to drink plenty of water and to keep her nutrition up te best she can.  Alysia Penna, MD

## 2017-11-10 ENCOUNTER — Telehealth: Payer: Self-pay | Admitting: Family Medicine

## 2017-11-10 NOTE — Telephone Encounter (Signed)
Copied from [place]. Topic: Quick Communication - See Telephone Encounter >> Nov 10, 2017  2:38 PM Cleaster Corin, NT wrote: CRM for notification. See Telephone encounter for:   11/10/17. Pt. Calling to speak with someone concerning labs pt. Can be reached at [phone]

## 2017-11-12 NOTE — Telephone Encounter (Signed)
Called and spoke with pt. Pt advised and voiced understanding.  

## 2017-11-16 ENCOUNTER — Telehealth: Payer: Self-pay

## 2017-11-16 NOTE — Telephone Encounter (Signed)
Patient would like to switch to Dr. Martinique. Okay to transfer?

## 2017-11-17 ENCOUNTER — Encounter: Payer: Self-pay | Admitting: Family Medicine

## 2017-11-17 ENCOUNTER — Ambulatory Visit (INDEPENDENT_AMBULATORY_CARE_PROVIDER_SITE_OTHER): Payer: Medicare Other | Admitting: Family Medicine

## 2017-11-17 VITALS — BP 108/60 | HR 105 | Temp 98.2°F | Wt 135.2 lb

## 2017-11-17 DIAGNOSIS — C182 Malignant neoplasm of ascending colon: Secondary | ICD-10-CM | POA: Diagnosis not present

## 2017-11-17 DIAGNOSIS — R82998 Other abnormal findings in urine: Secondary | ICD-10-CM | POA: Diagnosis not present

## 2017-11-17 DIAGNOSIS — N390 Urinary tract infection, site not specified: Secondary | ICD-10-CM

## 2017-11-17 LAB — POCT URINALYSIS DIPSTICK
BILIRUBIN UA: NEGATIVE
Blood, UA: NEGATIVE
GLUCOSE UA: NEGATIVE
KETONES UA: NEGATIVE
Nitrite, UA: POSITIVE
Spec Grav, UA: >=1.03 — AB (ref 1.010–1.025)
Urobilinogen, UA: 1 E.U./dL
pH, UA: 6 (ref 5.0–8.0)

## 2017-11-17 NOTE — Progress Notes (Signed)
   Subjective:    Patient ID: Shelley Jones, female    DOB: [birth year], 67 y.o.   MRN: [id number]  HPI Here with her husband for several days of RLQ pain and weakness. She has recurrent colon cancer and she saw Dr. Altamease Oiler at [facility] a few days ago. She will have general anesthesia tomorrow for a percutaneous liver biopsy. 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 She has also made an appt on 11-23-17 to see an Materials engineer at [facility] for a second opinion. She has some nausea but is not vomiting. She has Compazine and Zofran to use if needed. She is taking Oxycodone every 4-6 ours for pain. She has become slightly jaundiced and her urine is dark. Her last labs on 11-11-17 at [facility] showed a total bilirubin of 1.3, an AST of 98 and an ALT of 55. The recent MRI revealed no obvious obstruction of the CBD. She took 4 days of Cefdinir for a UTI when she left the hospital but then she was told to stop it. No fevers.    Review of Systems  Constitutional: Positive for diaphoresis. Negative for fever.  Respiratory: Negative.   Cardiovascular: Negative.   Gastrointestinal: Positive for abdominal distention, abdominal pain and nausea. Negative for anal bleeding, blood in stool, constipation, diarrhea, rectal pain and vomiting.  Genitourinary: Negative.   Skin: Positive for color change.  Neurological: Positive for weakness.       Objective:   Physical Exam  Constitutional: She is oriented to person, place, and time.  Frail and weak  Cardiovascular: Normal rate, regular rhythm, normal heart sounds and intact distal pulses.  Pulmonary/Chest: Effort normal and breath sounds normal. No respiratory distress. She has no wheezes. She has no rales.  Abdominal: Soft. Bowel sounds are normal. She exhibits no distension and no mass. There is no rebound and no guarding.  Mildly tender in the RUQ     Neurological: She is alert and oriented to person, place, and time.  Skin:  Slightly jaundiced           Assessment & Plan:  She still has a UTI. I advised her to start back on the Cefdinir 300 mg bid. We will culture today's sample. Drink plenty of water. She will have the liver biopsy tomorrow as planned.  Alysia Penna, MD

## 2017-11-17 NOTE — Telephone Encounter (Signed)
Dr. Martinique Okay to transfer?

## 2017-11-17 NOTE — Telephone Encounter (Signed)
That is fine with me.

## 2017-11-18 NOTE — Telephone Encounter (Signed)
Fine with me

## 2017-11-20 LAB — URINE CULTURE
MICRO NUMBER: 90122902
SPECIMEN QUALITY: ADEQUATE

## 2017-11-23 ENCOUNTER — Other Ambulatory Visit: Payer: Self-pay

## 2017-11-23 MED ORDER — NITROFURANTOIN MONOHYD MACRO 100 MG PO CAPS: 100.0000 mg | ORAL_CAPSULE | Freq: Two times a day (BID) | ORAL | 0 refills | Status: AC

## 2017-11-23 NOTE — Telephone Encounter (Signed)
Call in Macrobid 100 mg bid for 7 days  

## 2017-12-04 ENCOUNTER — Encounter: Payer: Medicare Other | Admitting: Family Medicine

## 2017-12-18 DEATH — deceased

## 2018-01-11 ENCOUNTER — Telehealth: Payer: Self-pay | Admitting: Family Medicine

## 2018-01-11 NOTE — Telephone Encounter (Signed)
Copied from [place] [id number]. Topic: General - Deceased Patient >> 2018-01-17  1:42 PM Arletha Grippe wrote: Reason for CRM: husband called - pt passed away on Nov 30, 2017. He just got call for her today   Route to department's PEC Pool.    FYI only-we will need to change the status for this patient. I will send a e mail to update this.
